# Patient Record
Sex: Male | Born: 1980 | Race: Black or African American | Hispanic: No | Marital: Married | Smoking: Never smoker
Health system: Southern US, Community
[De-identification: ages and names within clinical notes are randomized; demographics above are authoritative.]

## PROBLEM LIST (undated history)

## (undated) DIAGNOSIS — M3500 Sicca syndrome, unspecified: Secondary | ICD-10-CM

## (undated) DIAGNOSIS — J189 Pneumonia, unspecified organism: Secondary | ICD-10-CM

## (undated) DIAGNOSIS — N179 Acute kidney failure, unspecified: Secondary | ICD-10-CM

## (undated) DIAGNOSIS — I10 Essential (primary) hypertension: Secondary | ICD-10-CM

## (undated) DIAGNOSIS — L03211 Cellulitis of face: Secondary | ICD-10-CM

## (undated) DIAGNOSIS — M329 Systemic lupus erythematosus, unspecified: Secondary | ICD-10-CM

---

## 2015-06-17 HISTORY — PX: THORACENTESIS: SHX235

## 2015-06-28 DIAGNOSIS — N179 Acute kidney failure, unspecified: Secondary | ICD-10-CM | POA: Diagnosis present

## 2016-03-16 DIAGNOSIS — J189 Pneumonia, unspecified organism: Secondary | ICD-10-CM

## 2016-03-16 HISTORY — DX: Pneumonia, unspecified organism: J18.9

## 2016-04-22 ENCOUNTER — Observation Stay (HOSPITAL_COMMUNITY): Payer: BLUE CROSS/BLUE SHIELD

## 2016-04-22 ENCOUNTER — Inpatient Hospital Stay (HOSPITAL_COMMUNITY)
Admission: EM | Admit: 2016-04-22 | Discharge: 2016-04-28 | DRG: 683 | Disposition: A | Discharge status: Home / Self Care | Payer: BLUE CROSS/BLUE SHIELD | Attending: Internal Medicine | Admitting: Internal Medicine

## 2016-04-22 ENCOUNTER — Encounter (HOSPITAL_COMMUNITY): Payer: Self-pay | Admitting: Emergency Medicine

## 2016-04-22 DIAGNOSIS — R220 Localized swelling, mass and lump, head: Secondary | ICD-10-CM | POA: Diagnosis present

## 2016-04-22 DIAGNOSIS — J90 Pleural effusion, not elsewhere classified: Secondary | ICD-10-CM | POA: Diagnosis present

## 2016-04-22 DIAGNOSIS — I959 Hypotension, unspecified: Secondary | ICD-10-CM | POA: Diagnosis present

## 2016-04-22 DIAGNOSIS — D649 Anemia, unspecified: Secondary | ICD-10-CM | POA: Diagnosis present

## 2016-04-22 DIAGNOSIS — R509 Fever, unspecified: Secondary | ICD-10-CM

## 2016-04-22 DIAGNOSIS — M3500 Sicca syndrome, unspecified: Secondary | ICD-10-CM | POA: Diagnosis present

## 2016-04-22 DIAGNOSIS — Z79899 Other long term (current) drug therapy: Secondary | ICD-10-CM

## 2016-04-22 DIAGNOSIS — D638 Anemia in other chronic diseases classified elsewhere: Secondary | ICD-10-CM | POA: Diagnosis not present

## 2016-04-22 DIAGNOSIS — M329 Systemic lupus erythematosus, unspecified: Secondary | ICD-10-CM | POA: Diagnosis present

## 2016-04-22 DIAGNOSIS — T783XXA Angioneurotic edema, initial encounter: Secondary | ICD-10-CM | POA: Diagnosis present

## 2016-04-22 DIAGNOSIS — L03211 Cellulitis of face: Secondary | ICD-10-CM

## 2016-04-22 DIAGNOSIS — N179 Acute kidney failure, unspecified: Secondary | ICD-10-CM | POA: Diagnosis not present

## 2016-04-22 DIAGNOSIS — Z7952 Long term (current) use of systemic steroids: Secondary | ICD-10-CM

## 2016-04-22 DIAGNOSIS — Z880 Allergy status to penicillin: Secondary | ICD-10-CM

## 2016-04-22 DIAGNOSIS — Z8249 Family history of ischemic heart disease and other diseases of the circulatory system: Secondary | ICD-10-CM

## 2016-04-22 DIAGNOSIS — I10 Essential (primary) hypertension: Secondary | ICD-10-CM | POA: Diagnosis present

## 2016-04-22 DIAGNOSIS — R609 Edema, unspecified: Secondary | ICD-10-CM

## 2016-04-22 DIAGNOSIS — R700 Elevated erythrocyte sedimentation rate: Secondary | ICD-10-CM | POA: Diagnosis present

## 2016-04-22 DIAGNOSIS — Z833 Family history of diabetes mellitus: Secondary | ICD-10-CM

## 2016-04-22 DIAGNOSIS — Z888 Allergy status to other drugs, medicaments and biological substances status: Secondary | ICD-10-CM

## 2016-04-22 DIAGNOSIS — Z91041 Radiographic dye allergy status: Secondary | ICD-10-CM

## 2016-04-22 HISTORY — DX: Acute kidney failure, unspecified: N17.9

## 2016-04-22 HISTORY — DX: Essential (primary) hypertension: I10

## 2016-04-22 HISTORY — DX: Pneumonia, unspecified organism: J18.9

## 2016-04-22 HISTORY — DX: Sjogren syndrome, unspecified: M35.00

## 2016-04-22 HISTORY — DX: Systemic lupus erythematosus, unspecified: M32.9

## 2016-04-22 HISTORY — DX: Cellulitis of face: L03.211

## 2016-04-22 LAB — COMPREHENSIVE METABOLIC PANEL
ALK PHOS: 73 U/L (ref 38–126)
ALT: 31 U/L (ref 17–63)
AST: 47 U/L — ABNORMAL HIGH (ref 15–41)
Albumin: 2.8 g/dL — ABNORMAL LOW (ref 3.5–5.0)
Anion gap: 8 (ref 5–15)
BUN: 14 mg/dL (ref 6–20)
CALCIUM: 8.5 mg/dL — AB (ref 8.9–10.3)
CHLORIDE: 104 mmol/L (ref 101–111)
CO2: 27 mmol/L (ref 22–32)
CREATININE: 1.61 mg/dL — AB (ref 0.61–1.24)
GFR, EST NON AFRICAN AMERICAN: 53 mL/min — AB (ref >60)
Glucose, Bld: 141 mg/dL — ABNORMAL HIGH (ref 65–99)
Potassium: 3.7 mmol/L (ref 3.5–5.1)
Sodium: 139 mmol/L (ref 135–145)
Total Bilirubin: 0.3 mg/dL (ref 0.3–1.2)
Total Protein: 6.6 g/dL (ref 6.5–8.1)

## 2016-04-22 LAB — URINE MICROSCOPIC-ADD ON

## 2016-04-22 LAB — CBC
HCT: 32.7 % — ABNORMAL LOW (ref 39.0–52.0)
Hemoglobin: 9.9 g/dL — ABNORMAL LOW (ref 13.0–17.0)
MCH: 24.3 pg — ABNORMAL LOW (ref 26.0–34.0)
MCHC: 30.3 g/dL (ref 30.0–36.0)
MCV: 80.1 fL (ref 78.0–100.0)
PLATELETS: 210 10*3/uL (ref 150–400)
RBC: 4.08 MIL/uL — AB (ref 4.22–5.81)
RDW: 16 % — ABNORMAL HIGH (ref 11.5–15.5)
WBC: 4.9 10*3/uL (ref 4.0–10.5)

## 2016-04-22 LAB — PROTEIN / CREATININE RATIO, URINE
Creatinine, Urine: 278.04 mg/dL
PROTEIN CREATININE RATIO: 0.46 mg/mg{creat} — AB (ref 0.00–0.15)
TOTAL PROTEIN, URINE: 129 mg/dL

## 2016-04-22 LAB — URINALYSIS, ROUTINE W REFLEX MICROSCOPIC
Glucose, UA: NEGATIVE mg/dL
Hgb urine dipstick: NEGATIVE
Ketones, ur: 15 mg/dL — AB
NITRITE: NEGATIVE
PROTEIN: 100 mg/dL — AB
Specific Gravity, Urine: 1.021 (ref 1.005–1.030)
pH: 5.5 (ref 5.0–8.0)

## 2016-04-22 LAB — SODIUM, URINE, RANDOM: SODIUM UR: 47 mmol/L

## 2016-04-22 LAB — LACTIC ACID, PLASMA: Lactic Acid, Venous: 2.6 mmol/L (ref 0.5–1.9)

## 2016-04-22 LAB — C-REACTIVE PROTEIN: CRP: 3.7 mg/dL — AB (ref <1.0)

## 2016-04-22 LAB — SEDIMENTATION RATE: SED RATE: 49 mm/h — AB (ref 0–16)

## 2016-04-22 MED ORDER — SODIUM CHLORIDE 0.9 % IV BOLUS (SEPSIS)
1000.0000 mL | Freq: Once | INTRAVENOUS | Status: AC
  Administered 2016-04-22: 1000 mL via INTRAVENOUS

## 2016-04-22 MED ORDER — VANCOMYCIN HCL IN DEXTROSE 1-5 GM/200ML-% IV SOLN
1000.0000 mg | Freq: Once | INTRAVENOUS | Status: DC
  Filled 2016-04-22: qty 200

## 2016-04-22 MED ORDER — ACETAMINOPHEN 650 MG RE SUPP: 650.0000 mg | Freq: Four times a day (QID) | RECTAL | Status: DC | PRN

## 2016-04-22 MED ORDER — SODIUM CHLORIDE 0.9 % IV SOLN
INTRAVENOUS | Status: AC
  Administered 2016-04-22 – 2016-04-23 (×2): via INTRAVENOUS

## 2016-04-22 MED ORDER — ONDANSETRON HCL 4 MG/2ML IJ SOLN: 4.0000 mg | Freq: Four times a day (QID) | INTRAMUSCULAR | Status: DC | PRN

## 2016-04-22 MED ORDER — ENOXAPARIN SODIUM 40 MG/0.4ML ~~LOC~~ SOLN
40.0000 mg | SUBCUTANEOUS | Status: DC
  Administered 2016-04-23 – 2016-04-28 (×6): 40 mg via SUBCUTANEOUS
  Filled 2016-04-22 (×6): qty 0.4

## 2016-04-22 MED ORDER — ONDANSETRON HCL 4 MG PO TABS: 4.0000 mg | ORAL_TABLET | Freq: Four times a day (QID) | ORAL | Status: DC | PRN

## 2016-04-22 MED ORDER — PREDNISONE 10 MG PO TABS
10.0000 mg | ORAL_TABLET | Freq: Every day | ORAL | Status: DC
  Administered 2016-04-23: 10 mg via ORAL
  Filled 2016-04-22: qty 1

## 2016-04-22 MED ORDER — ACETAMINOPHEN 325 MG PO TABS
650.0000 mg | ORAL_TABLET | Freq: Four times a day (QID) | ORAL | Status: DC | PRN
  Administered 2016-04-23 – 2016-04-28 (×5): 650 mg via ORAL
  Filled 2016-04-22 (×5): qty 2

## 2016-04-22 MED ORDER — HYDROXYCHLOROQUINE SULFATE 200 MG PO TABS
400.0000 mg | ORAL_TABLET | Freq: Every day | ORAL | Status: DC
  Administered 2016-04-23: 200 mg via ORAL
  Administered 2016-04-24 – 2016-04-28 (×5): 400 mg via ORAL
  Filled 2016-04-22 (×7): qty 2

## 2016-04-22 NOTE — ED Triage Notes (Signed)
Pt here for swelling to face x 1 week now under neck; pt sts hx of cellulitis in past

## 2016-04-22 NOTE — ED Notes (Signed)
Admitting MD verbalized to not give pt vancomycin after hanging antibiotics and connecting it to the pt. Vancomycin not given.

## 2016-04-22 NOTE — ED Provider Notes (Signed)
[facility]-EMERGENCY DEPT Provider Note   CSN: [id number] Arrival date & time: 04/22/16  1552     History   Chief Complaint Chief Complaint  Patient presents with  . Facial Swelling    HPI Christell Constantony Thome is a 35 y.o. male.  HPI Pt with hx of SLE on plaquenil and prednisone comes in with cc of facial swelling. PT reports that his swelling started a week ago, and today the swelling spread to the neck, so he decided to come to the ER. Pt has hx of cellulitis with similar symptoms and needed admission. Pt has no n/v/f/c. No dental pain or drainage.  Past Medical History:  Diagnosis Date  . Hypertension   . Lupus     Patient Active Problem List   Diagnosis Date Noted  . Essential hypertension 04/22/2016  . Sjogren's syndrome (HCC) 04/22/2016  . SLE (systemic lupus erythematosus) (HCC) 04/22/2016  . Anemia in other chronic diseases classified elsewhere 04/22/2016  . Facial swelling 04/22/2016  . AKI (acute kidney injury) (HCC) 06/28/2015    Past Surgical History:  Procedure Laterality Date  . THORACENTESIS         Home Medications    Prior to Admission medications   Medication Sig Start Date End Date Taking? Authorizing Provider  hydroxychloroquine (PLAQUENIL) 200 MG tablet Take 400 mg by mouth daily.   Yes Historical Provider, MD  nebivolol (BYSTOLIC) 5 MG tablet Take 5 mg by mouth daily.   Yes Historical Provider, MD  predniSONE (DELTASONE) 10 MG tablet Take 10 mg by mouth daily with breakfast.   Yes Historical Provider, MD    Family History Family History  Problem Relation Age of Onset  . Hypertension Mother   . Diabetes Mother   . Diabetes Brother   . Hypertension Brother     Social History Social History  Substance Use Topics  . Smoking status: Never Smoker  . Smokeless tobacco: Never Used  . Alcohol use No     Allergies   Amoxicillin-pot clavulanate; Penicillins; Sulfamethoxazole-trimethoprim; Ivp dye [iodinated diagnostic agents]; Levaquin  [levofloxacin]; and Sulfa antibiotics   Review of Systems Review of Systems  ROS 10 Systems reviewed and are negative for acute change except as noted in the HPI.     Physical Exam Updated Vital Signs BP 114/67 (BP Location: Left Arm)   Pulse 89   Temp 98.4 F (36.9 C) (Oral)   Resp 18   Ht 5\' 9"  (1.753 m)   Wt 193 lb (87.5 kg)   SpO2 95%   BMI 28.50 kg/m   Physical Exam  Constitutional: He is oriented to person, place, and time. He appears well-developed.  HENT:  Head: Normocephalic and atraumatic.  No dental tenderness. gingva normal. No trismus.  + facial swelling  Eyes: Conjunctivae and EOM are normal. Pupils are equal, round, and reactive to light.  Neck: Normal range of motion. Neck supple.  Cardiovascular: Normal rate and regular rhythm.   Pulmonary/Chest: Effort normal and breath sounds normal.  Abdominal: Soft. Bowel sounds are normal. He exhibits no distension. There is no tenderness. There is no rebound and no guarding.  Neurological: He is alert and oriented to person, place, and time.  Skin: Skin is warm.  Nursing note and vitals reviewed.    ED Treatments / Results  Labs (all labs ordered are listed, but only abnormal results are displayed) Labs Reviewed  COMPREHENSIVE METABOLIC PANEL - Abnormal; Notable for the following:       Result Value   Glucose, Bld  141 (*)    Creatinine, Ser 1.61 (*)    Calcium 8.5 (*)    Albumin 2.8 (*)    AST 47 (*)    GFR calc non Af Amer 53 (*)    All other components within normal limits  CBC - Abnormal; Notable for the following:    RBC 4.08 (*)    Hemoglobin 9.9 (*)    HCT 32.7 (*)    MCH 24.3 (*)    RDW 16.0 (*)    All other components within normal limits  CULTURE, BLOOD (ROUTINE X 2)  CULTURE, BLOOD (ROUTINE X 2)  URINALYSIS, ROUTINE W REFLEX MICROSCOPIC (NOT AT [facility])  SODIUM, URINE, RANDOM  PROTEIN / CREATININE RATIO, URINE  C3 COMPLEMENT  C4 COMPLEMENT  ANTI-DNA ANTIBODY, DOUBLE-STRANDED    LACTIC ACID, PLASMA  SEDIMENTATION RATE  C-REACTIVE PROTEIN  BASIC METABOLIC PANEL  CBC    EKG  EKG Interpretation None       Radiology Dg Chest 2 View  Result Date: 04/22/2016 CLINICAL DATA:  Swelling to phase for 1 week cellulitis EXAM: CHEST  2 VIEW COMPARISON:  None. FINDINGS: There are low lung volumes. There are tiny bilateral pleural effusions. Mild streaky bibasilar atelectasis. Heart size upper normal. Pulmonary vascularity within normal limits. No pneumothorax. IMPRESSION: 1. Low lung volumes with small bilateral effusions 2. Streaky bibasilar atelectasis Electronically Signed   By: Jasmine PangKim  Fujinaga M.D.   On: 04/22/2016 21:06    Procedures Procedures (including critical care time)  Medications Ordered in ED Medications  hydroxychloroquine (PLAQUENIL) tablet 400 mg (not administered)  predniSONE (DELTASONE) tablet 10 mg (not administered)  enoxaparin (LOVENOX) injection 40 mg (not administered)  0.9 %  sodium chloride infusion (not administered)  acetaminophen (TYLENOL) tablet 650 mg (not administered)    Or  acetaminophen (TYLENOL) suppository 650 mg (not administered)  ondansetron (ZOFRAN) tablet 4 mg (not administered)    Or  ondansetron (ZOFRAN) injection 4 mg (not administered)     Initial Impression / Assessment and Plan / ED Course  I have reviewed the triage vital signs and the nursing notes.  Pertinent labs & imaging results that were available during my care of the patient were reviewed by me and considered in my medical decision making (see chart for details).  Clinical Course     Immunosuppressed male with facial swelling and fevers at home. Also reports neck swelling today. Airway is fine, and pt is controlling secretions fine. Pt has angioedema as the reaction to contrast - so we will no get CT. Admit with iv antibiotics - that Dr. Maryfrances Bunnellanford will decide upon after seeing the patient.  Final Clinical Impressions(s) / ED Diagnoses   Final  diagnoses:  Facial cellulitis  Fever    New Prescriptions Current Discharge Medication List       Derwood KaplanAnkit Clarke Peretz, MD 04/22/16 2250

## 2016-04-22 NOTE — ED Notes (Signed)
Admitting MD at bedside.

## 2016-04-22 NOTE — Progress Notes (Signed)
CRITICAL VALUE ALERT  Critical value received: Lactic acid  Date of notification:  04/22/16  Time of notification:  2219  Critical value read back:Yes.    Nurse who received alert:  Nelda MarseilleJenny Thacker, RN  MD notified (1st page):  Dr. Maryfrances Bunnellanford  Time of first page: 2224  MD notified (2nd page):  Time of second page:  Responding MD:  2225  Time MD responded: Dr. Maryfrances Bunnellanford, put in NS bolus 1 L

## 2016-04-22 NOTE — Progress Notes (Signed)
Received report from ED. Somara Frymire Thacker, RN 

## 2016-04-22 NOTE — Progress Notes (Addendum)
The patient is noted to have a lactate>2. With the current information available to me, I don't think the patient has infection or sepsis. The lactate>2, is related to dehydration. Will give fluids and repeat lactic acid and continue to monitor clinically.

## 2016-04-22 NOTE — Progress Notes (Signed)
Pt admitted to 5w19 from ED. Pt is A&Ox4. Pt lives at home. Pt's neck is slightly swollen. Pt's skin is warm, dry and intact. Pt oriented to room. Told to call for assistance. Will continue to monitor pt. Jerrye BushyJenny Thacker,RN

## 2016-04-22 NOTE — H&P (Signed)
History and Physical  Patient Name: Marvin Wong     TZG:[id number]    DOB: 06/08/1981    DOA: 04/22/2016 PCP: No PCP Per Patient  Rheumatology: Dr. Rosette Reveal, [place]  [phone] Patient coming from: Home  Chief Complaint: Facial swelling, fever  HPI: Marvin Wong is a 35 y.o. male with a past medical history significant for SLE and Sjogren's and HTN who presents with facial swelling for 1 week.  The patient was in his usual state of health until about 10 days ago when he had onset of swelling in his cheeks on both sides without rash.  This waxed and waned, with mild discomfort for about a week, and then in the last few days, the cheeks resolved, but he developed a fullness appearance under his neck.  This was without stridor, tongue or lip swelling, dyspnea, redness or pain.  He has had a low grade fever on and off this week as well as vague malaise.  No new joint pain, other pain, rashes, oral lesions.  He was admitted to [facility] again 1 month ago for pneumonia, treated with ceftriaxone and azithromycin for a week and discharged in stable condition.    ED course: -Afebrile, heart rate 100s, respirations and pulse ox normal, BP 97/62 -Na 139, K 3.7, Cr 1.67 (baseline 0.8 in Feb), WBC 4.9K, Hgb 9.9 and normocytic, stable -Albumin 2.9, AST minimal elevation -Vancomycin was ordered for facial cellulitis and TRH were asked to evaluate   The patient has a history of facial swelling in the past.  18 months ago he was admitted to [facility] for facial cellulitis, treated with IV antibiotics to resolution he thinks.  He thinks he had another episode in January of this year at [facility], although these records were obtained via [facility] and show that he was transferred to [facility] from [place] for pneumonia and facial swelling, and his facial swelling was judged to be "angioedema" from a drug reaction to Augmentin and Zosyn rather than from infection.  He was also having a  lupus flare at that time as well as AKI with significant proteinuria and was treated with solumedrol and his facial swelling resolved.  He was back at [facility] in Feb 2017 for thoracentesis (WBC 4224/mm^3, ph 7.7, LDH 276, protein 4.9, and negative culture).           ROS: Review of Systems  Constitutional: Positive for fever and malaise/fatigue.  HENT: Negative for congestion and sore throat.   Respiratory: Positive for sputum production (persistent from pneumonia, not new or worsening).   Musculoskeletal: Positive for joint pain (chronic, none new).  Skin: Negative for rash.  All other systems reviewed and are negative.         Past Medical History:  Diagnosis Date  . Hypertension   . Lupus     Past Surgical History:  Procedure Laterality Date  . THORACENTESIS      Social History: Patient lives with his wife.  The patient walks unassisted.  He works at [employer].  He lives in [place].  He does not smoke.    Allergies  Allergen Reactions  . Amoxicillin-Pot Clavulanate Swelling  . Penicillins Swelling  . Sulfamethoxazole-Trimethoprim Nausea And Vomiting  . Ivp Dye [Iodinated Diagnostic Agents]   . Levaquin [Levofloxacin]   . Sulfa Antibiotics     Family history: family history includes Diabetes in his brother and mother; Hypertension in his brother and mother.  Prior to Admission medications   Medication Sig Start Date  End Date Taking? Authorizing Provider  hydroxychloroquine (PLAQUENIL) 200 MG tablet Take 400 mg by mouth daily.   Yes Historical Provider, MD  nebivolol (BYSTOLIC) 5 MG tablet Take 5 mg by mouth daily.   Yes Historical Provider, MD  predniSONE (DELTASONE) 10 MG tablet Take 10 mg by mouth daily with breakfast.   Yes Historical Provider, MD       Physical Exam: BP 105/74 (BP Location: Right Arm)   Pulse 90   Temp 98.2 F (36.8 C) (Oral)   Resp 18   Ht _0  (1.753 m)   Wt 87.5 kg (193 lb)   SpO2 97%   BMI 28.50 kg/m  General appearance:  Well-developed, adult male, alert and in no acute distress, appear tired.   Eyes: Anicteric, conjunctiva pink, lids and lashes normal. PERRL.    ENT: No nasal deformity, discharge, epistaxis.  Hearing normal. OP moist without lesions.  No lip swelling, tongue swelling, posterior pharynx lesions. Neck: No neck masses.  Trachea midline.  No thyromegaly/tenderness.  No neck tenderness. Lymph: No cervical or supraclavicular lymphadenopathy. Skin: Warm and dry.  No jaundice.  No suspicious rashes or lesions.  ?Mild fullness below neck per sister at bedside, otherwise no tissue edema, swelling, tenderness, fluctuance, mass in neck.     Cardiac: RRR, nl S1-S2, no murmurs appreciated.  Capillary refill is brisk.  JVP not visible.  No LE edema.  Radial and DP pulses 2+ and symmetric. Respiratory: Normal respiratory rate and rhythm.  CTAB without rales or wheezes. Abdomen: Abdomen soft.  No TTP. No ascites, distension, hepatosplenomegaly.   MSK: No deformities or effusions.  No cyanosis or clubbing. Neuro: Cranial nerves normal.  Sensation intact to light touch. Speech is fluent.  Muscle strength normal.    Psych: Sensorium intact and responding to questions, attention normal.  Behavior appropriate.  Affectblunted.  Judgment and insight appear normal.     Labs on Admission:  I have personally reviewed following labs and imaging studies: CBC:  Recent Labs Lab 04/22/16 1610  WBC 4.9  HGB 9.9*  HCT 32.7*  MCV 80.1  PLT 622   Basic Metabolic Panel:  Recent Labs Lab 04/22/16 1610  NA 139  K 3.7  CL 104  CO2 27  GLUCOSE 141*  BUN 14  CREATININE 1.61*  CALCIUM 8.5*   GFR: Estimated Creatinine Clearance: 68.8 mL/min (by C-G formula based on SCr of 1.61 mg/dL (H)).  Liver Function Tests:  Recent Labs Lab 04/22/16 1610  AST 47*  ALT 31  ALKPHOS 73  BILITOT 0.3  PROT 6.6  ALBUMIN 2.8*   No results for input(s): LIPASE, AMYLASE in the last 168 hours. No results for input(s):  AMMONIA in the last 168 hours. Coagulation Profile: No results for input(s): INR, PROTIME in the last 168 hours. Cardiac Enzymes: No results for input(s): CKTOTAL, CKMB, CKMBINDEX, TROPONINI in the last 168 hours. BNP (last 3 results) No results for input(s): PROBNP in the last 8760 hours. HbA1C: No results for input(s): HGBA1C in the last 72 hours. CBG: No results for input(s): GLUCAP in the last 168 hours. Lipid Profile: No results for input(s): CHOL, HDL, LDLCALC, TRIG, CHOLHDL, LDLDIRECT in the last 72 hours. Thyroid Function Tests: No results for input(s): TSH, T4TOTAL, FREET4, T3FREE, THYROIDAB in the last 72 hours. Anemia Panel: No results for input(s): VITAMINB12, FOLATE, FERRITIN, TIBC, IRON, RETICCTPCT in the last 72 hours. Sepsis Labs: Lactate pending Invalid input(s): PROCALCITONIN, LACTICIDVEN No results found for this or any previous visit (from  the past 240 hour(s)).       Radiological Exams on Admission: Personally reviewed CXR shows no airspace disease or significant effusions: Dg Chest 2 View  Result Date: 04/22/2016 CLINICAL DATA:  Swelling to phase for 1 week cellulitis EXAM: CHEST  2 VIEW COMPARISON:  None. FINDINGS: There are low lung volumes. There are tiny bilateral pleural effusions. Mild streaky bibasilar atelectasis. Heart size upper normal. Pulmonary vascularity within normal limits. No pneumothorax. IMPRESSION: 1. Low lung volumes with small bilateral effusions 2. Streaky bibasilar atelectasis Electronically Signed   By: Donavan Foil M.D.   On: 04/22/2016 21:06        Assessment/Plan  1. AKI:  Baseline creatinine 0.8, creatinine currently doubled.  No recent NSAIDs, hypotension known (although he is relatively hypotensive at present).  No significant vomiting, diarrhea.       -Check UA -Check urine protein -Check FeNA -Administer fluid challenge -Repeat BMP tomorrow -Avoid nephrotoxins  -Check dsDNA titers, complements, ESR/CRP   2.  Hypertension:  -Hold Bystolic given relative hypotension at present  3. Facial swelling:  No fever, leukocytosis.  No tissue changes on my exam to suggest cellulitis (skin is soft, nontender).  Time course and migratory location of swelling also militate against diagnosis of cellulitis.    4. SLE and Sjogrens:  -Continue prednisone 10 mg for now -Continue plaquenil  5. Fever:  -Check CXR -Check blood cultures             DVT prophylaxis: Lovenox  Code Status: FULL  Family Communication: Sister at bedsdie  Disposition Plan: Anticipate further studies, observe facial swelling for recurrence, monitor renal function and follow up lab studies from tonight.  If lupus flare contributing, will discuss with Rheum Dr. Ena Dawley (phone # listed above) and start steroids, possibly renal as well. Consults called: None overnight Admission status: OBS, med surg At the point of initial evaluation, it is my clinical opinion that admission for OBSERVATION is reasonable and necessary because the patient's presenting complaints in the context of their chronic conditions represent sufficient risk of deterioration or significant morbidity to constitute reasonable grounds for close observation in the hospital setting, but that the patient may be medically stable for discharge from the hospital within 24 to 48 hours.    Medical decision making: Patient seen at 8:05 PM on 04/22/2016.  The patient was discussed with Dr. Kathrynn Humble.  What exists of the patient's chart was reviewed in depth and outside records were obtained and summarized above.  Clinical condition: stable.        Edwin Dada [facility] Pager [phone]

## 2016-04-22 NOTE — ED Notes (Signed)
Phlebotomy at bedside.

## 2016-04-22 NOTE — ED Notes (Signed)
Patient transported to X-ray 

## 2016-04-23 DIAGNOSIS — M329 Systemic lupus erythematosus, unspecified: Secondary | ICD-10-CM | POA: Diagnosis present

## 2016-04-23 DIAGNOSIS — Z7952 Long term (current) use of systemic steroids: Secondary | ICD-10-CM | POA: Diagnosis not present

## 2016-04-23 DIAGNOSIS — R220 Localized swelling, mass and lump, head: Secondary | ICD-10-CM | POA: Diagnosis not present

## 2016-04-23 DIAGNOSIS — Z8249 Family history of ischemic heart disease and other diseases of the circulatory system: Secondary | ICD-10-CM | POA: Diagnosis not present

## 2016-04-23 DIAGNOSIS — Z79899 Other long term (current) drug therapy: Secondary | ICD-10-CM | POA: Diagnosis not present

## 2016-04-23 DIAGNOSIS — M3500 Sicca syndrome, unspecified: Secondary | ICD-10-CM | POA: Diagnosis present

## 2016-04-23 DIAGNOSIS — D649 Anemia, unspecified: Secondary | ICD-10-CM | POA: Diagnosis present

## 2016-04-23 DIAGNOSIS — I10 Essential (primary) hypertension: Secondary | ICD-10-CM | POA: Diagnosis present

## 2016-04-23 DIAGNOSIS — M3219 Other organ or system involvement in systemic lupus erythematosus: Secondary | ICD-10-CM | POA: Diagnosis not present

## 2016-04-23 DIAGNOSIS — J90 Pleural effusion, not elsewhere classified: Secondary | ICD-10-CM | POA: Diagnosis present

## 2016-04-23 DIAGNOSIS — R509 Fever, unspecified: Secondary | ICD-10-CM | POA: Diagnosis present

## 2016-04-23 DIAGNOSIS — Z888 Allergy status to other drugs, medicaments and biological substances status: Secondary | ICD-10-CM | POA: Diagnosis not present

## 2016-04-23 DIAGNOSIS — N179 Acute kidney failure, unspecified: Secondary | ICD-10-CM | POA: Diagnosis present

## 2016-04-23 DIAGNOSIS — I959 Hypotension, unspecified: Secondary | ICD-10-CM | POA: Diagnosis present

## 2016-04-23 DIAGNOSIS — Z880 Allergy status to penicillin: Secondary | ICD-10-CM | POA: Diagnosis not present

## 2016-04-23 DIAGNOSIS — R700 Elevated erythrocyte sedimentation rate: Secondary | ICD-10-CM | POA: Diagnosis present

## 2016-04-23 DIAGNOSIS — T783XXA Angioneurotic edema, initial encounter: Secondary | ICD-10-CM | POA: Diagnosis present

## 2016-04-23 DIAGNOSIS — D638 Anemia in other chronic diseases classified elsewhere: Secondary | ICD-10-CM | POA: Diagnosis not present

## 2016-04-23 DIAGNOSIS — Z91041 Radiographic dye allergy status: Secondary | ICD-10-CM | POA: Diagnosis not present

## 2016-04-23 DIAGNOSIS — Z833 Family history of diabetes mellitus: Secondary | ICD-10-CM | POA: Diagnosis not present

## 2016-04-23 LAB — CBC
HEMATOCRIT: 34.7 % — AB (ref 39.0–52.0)
HEMOGLOBIN: 10.4 g/dL — AB (ref 13.0–17.0)
MCH: 23.9 pg — AB (ref 26.0–34.0)
MCHC: 30 g/dL (ref 30.0–36.0)
MCV: 79.8 fL (ref 78.0–100.0)
Platelets: 192 10*3/uL (ref 150–400)
RBC: 4.35 MIL/uL (ref 4.22–5.81)
RDW: 16.1 % — ABNORMAL HIGH (ref 11.5–15.5)
WBC: 2.7 10*3/uL — ABNORMAL LOW (ref 4.0–10.5)

## 2016-04-23 LAB — INFLUENZA PANEL BY PCR (TYPE A & B)
Influenza A By PCR: NEGATIVE
Influenza B By PCR: NEGATIVE

## 2016-04-23 LAB — BASIC METABOLIC PANEL
ANION GAP: 10 (ref 5–15)
BUN: 17 mg/dL (ref 6–20)
CALCIUM: 8.2 mg/dL — AB (ref 8.9–10.3)
CO2: 23 mmol/L (ref 22–32)
Chloride: 106 mmol/L (ref 101–111)
Creatinine, Ser: 1.23 mg/dL (ref 0.61–1.24)
GFR calc Af Amer: >60 mL/min (ref >60)
GFR calc non Af Amer: >60 mL/min (ref >60)
GLUCOSE: 85 mg/dL (ref 65–99)
Potassium: 4.1 mmol/L (ref 3.5–5.1)
Sodium: 139 mmol/L (ref 135–145)

## 2016-04-23 LAB — RESPIRATORY PANEL BY PCR
Adenovirus: NOT DETECTED
Bordetella pertussis: NOT DETECTED
CORONAVIRUS OC43-RVPPCR: NOT DETECTED
Chlamydophila pneumoniae: NOT DETECTED
Coronavirus 229E: NOT DETECTED
Coronavirus HKU1: NOT DETECTED
Coronavirus NL63: NOT DETECTED
INFLUENZA A-RVPPCR: NOT DETECTED
INFLUENZA B-RVPPCR: NOT DETECTED
METAPNEUMOVIRUS-RVPPCR: NOT DETECTED
Mycoplasma pneumoniae: NOT DETECTED
PARAINFLUENZA VIRUS 1-RVPPCR: NOT DETECTED
PARAINFLUENZA VIRUS 2-RVPPCR: NOT DETECTED
PARAINFLUENZA VIRUS 4-RVPPCR: NOT DETECTED
Parainfluenza Virus 3: NOT DETECTED
RESPIRATORY SYNCYTIAL VIRUS-RVPPCR: NOT DETECTED
RHINOVIRUS / ENTEROVIRUS - RVPPCR: NOT DETECTED

## 2016-04-23 LAB — LACTIC ACID, PLASMA: LACTIC ACID, VENOUS: 1.9 mmol/L (ref 0.5–1.9)

## 2016-04-23 MED ORDER — PREDNISONE 20 MG PO TABS
20.0000 mg | ORAL_TABLET | Freq: Two times a day (BID) | ORAL | Status: DC
Start: 2016-04-23 — End: 2016-04-28
  Administered 2016-04-23 – 2016-04-28 (×9): 20 mg via ORAL
  Filled 2016-04-23 (×9): qty 1

## 2016-04-23 MED ORDER — BENZONATATE 100 MG PO CAPS: 200.0000 mg | ORAL_CAPSULE | Freq: Three times a day (TID) | ORAL | Status: DC | PRN

## 2016-04-23 MED ORDER — PREDNISONE 20 MG PO TABS: 40.0000 mg | ORAL_TABLET | Freq: Every day | ORAL | Status: DC

## 2016-04-23 MED ORDER — DEXTROSE 5 % IV SOLN
1.0000 g | INTRAVENOUS | Status: DC
  Administered 2016-04-23 – 2016-04-24 (×2): 1 g via INTRAVENOUS
  Filled 2016-04-23 (×2): qty 10

## 2016-04-23 MED ORDER — SODIUM CHLORIDE 0.9 % IV SOLN
INTRAVENOUS | Status: DC
  Administered 2016-04-24 (×2): via INTRAVENOUS

## 2016-04-23 MED ORDER — PREDNISONE 10 MG PO TABS
10.0000 mg | ORAL_TABLET | Freq: Once | ORAL | Status: AC
  Administered 2016-04-23: 10 mg via ORAL
  Filled 2016-04-23: qty 1

## 2016-04-23 NOTE — Progress Notes (Signed)
Notified Dr. Maryfrances Bunnellanford that pt's temp is 102.1 and pt having chills. MD put in order for Rocephin and stated to give tylenol as ordered for temp. Will continue to monitor pt. Nelda MarseilleJenny Thacker, RN

## 2016-04-23 NOTE — Progress Notes (Signed)
Temp up to 103.5 this am, pt given tylenol per prn order. Pt declined to take am plaquenil--states after he took dose yesterday it made his throat feel funny.  Pt's family at bedside inquiring about antibiotics and asking if they will put him on more than one, states pt had been on vancomycin at another hospital when he had something like this before   Message sent to Dr Catha GosselinMikhail.

## 2016-04-23 NOTE — Progress Notes (Signed)
PROGRESS NOTE    Marvin Wong  ZOX:[id number] DOB: 10/25/1980 DOA: 04/22/2016 PCP: No PCP Per Patient   Chief Complaint  Patient presents with  . Facial Swelling    Brief Narrative:  Marvin Wong is a 35 y.o. male with a past medical history significant for SLE and Sjogren's and HTN who presents with facial swelling for 1 week.   The patient was in his usual state of health until about 10 days ago when he had onset of swelling in his cheeks on both sides without rash.  This waxed and waned, with mild discomfort for about a week, and then in the last few days, the cheeks resolved, but he developed a fullness appearance under his neck.  This was without stridor, tongue or lip swelling, dyspnea, redness or pain.  He has had a low grade fever on and off this week as well as vague malaise.  No new joint pain, other pain, rashes, oral lesions. He was admitted to [facility] 1 month ago for pneumonia, treated with ceftriaxone and azithromycin for a week and discharged in stable condition.    Admitted for AKI and facial swelling.  Found to have SIRS. Blood/urine cultures pending. Also ordered flu/viral panel.   Assessment & Plan   Acute kidney injury -Baseline creatinine approximately 0.8, upon admission 1.61 -Currently creatinine 1.23 -Suspect due to dehydration -Continue IV fluids -Continue to monitor BMP  SIRS -Unknown infectious etiology at this time. -UA and chest x-ray unremarkable for infection -Patient started empirically on ceftriaxone -Given fever as well as general malaise, will workup for influenza or viral cause -Family continues to ask about starting more antibiotics as patient was recently [facility] -Spoke with infectious disease, agree with current plan of working up for viral illness. Facial swelling -Cannot appreciate cellulitis or any tissue changes on exam -Supposedly has been ongoing for over a week -Question whether  this is due to lupus flare versus infection -Continue prednisone -Will order antitussive  Lupus and Sjogren's -Continue prednisone and Plaquenil  Essential hypertension -Bystolic held due to relative hypotension  DVT Prophylaxis  lovenox  Code Status: Full  Family Communication: None at bedside. Spoke with patient's PCP, Dr. Floydene FlockViola Jones, via phone.  Disposition Plan: Observation. Discharge to home when stable.   Consultants Infectious disease, via phone  Procedures  None  Antibiotics   Anti-infectives    Start     Dose/Rate Route Frequency Ordered Stop   04/23/16 1000  hydroxychloroquine (PLAQUENIL) tablet 400 mg     400 mg Oral Daily 04/22/16 2203     04/23/16 0300  cefTRIAXone (ROCEPHIN) 1 g in dextrose 5 % 50 mL IVPB     1 g 100 mL/hr over 30 Minutes Intravenous Every 24 hours 04/23/16 0229     04/22/16 1945  vancomycin (VANCOCIN) IVPB 1000 mg/200 mL premix  Status:  Discontinued     1,000 mg 200 mL/hr over 60 Minutes Intravenous  Once 04/22/16 1934 04/22/16 2035      Subjective:   Marvin Wong seen and examined today.  Patient continues to complain of facial and neck swelling.  Complains of sore throat and feeling tired. Complains of dry cough.  Denies chest pain, shortness of breath, abdominal pain, N/V/C/D.     Objective:   Vitals:   04/23/16 0220 04/23/16 0402 04/23/16 0500 04/23/16 0949  BP:   (!) 93/51   Pulse:   99   Resp:   18   Temp: (!) 102.1  F (38.9 C) (!) 102.5 F (39.2 C) 100.3 F (37.9 C) (!) 103.5 F (39.7 C)  TempSrc: Oral Oral Oral Oral  SpO2:   96%   Weight:      Height:        Intake/Output Summary (Last 24 hours) at 04/23/16 1309 Last data filed at 04/23/16 1120  Gross per 24 hour  Intake             2265 ml  Output             1325 ml  Net              940 ml   Filed Weights   04/22/16 1601  Weight: 87.5 kg (193 lb)    Exam  General: Well developed, well nourished, NAD, appears stated age  HEENT: NCAT, PERRLA, EOMI,  Anicteic Sclera, mucous membranes moist. No areas of fluctuance, no lip/tongue swelling  Neck: Supple, no JVD, no masses  Cardiovascular: S1 S2 auscultated, no rubs, murmurs or gallops. Regular rate and rhythm.  Respiratory: Clear to auscultation bilaterally with equal chest rise  Abdomen: Soft, nontender, nondistended, + bowel sounds  Extremities: warm dry without cyanosis clubbing or edema  Neuro: AAOx3, cranial nerves grossly intact. Strength 5/5 in patient's upper and lower extremities bilaterally  Skin: Without rashes exudates or nodules  Psych: Normal affect and demeanor with intact judgement and insight  Data Reviewed: I have personally reviewed following labs and imaging studies  CBC:  Recent Labs Lab 04/22/16 1610 04/23/16 0213  WBC 4.9 2.7*  HGB 9.9* 10.4*  HCT 32.7* 34.7*  MCV 80.1 79.8  PLT 210 192   Basic Metabolic Panel:  Recent Labs Lab 04/22/16 1610 04/23/16 0213  NA 139 139  K 3.7 4.1  CL 104 106  CO2 27 23  GLUCOSE 141* 85  BUN 14 17  CREATININE 1.61* 1.23  CALCIUM 8.5* 8.2*   GFR: Estimated Creatinine Clearance: 90 mL/min (by C-G formula based on SCr of 1.23 mg/dL). Liver Function Tests:  Recent Labs Lab 04/22/16 1610  AST 47*  ALT 31  ALKPHOS 73  BILITOT 0.3  PROT 6.6  ALBUMIN 2.8*   No results for input(s): LIPASE, AMYLASE in the last 168 hours. No results for input(s): AMMONIA in the last 168 hours. Coagulation Profile: No results for input(s): INR, PROTIME in the last 168 hours. Cardiac Enzymes: No results for input(s): CKTOTAL, CKMB, CKMBINDEX, TROPONINI in the last 168 hours. BNP (last 3 results) No results for input(s): PROBNP in the last 8760 hours. HbA1C: No results for input(s): HGBA1C in the last 72 hours. CBG: No results for input(s): GLUCAP in the last 168 hours. Lipid Profile: No results for input(s): CHOL, HDL, LDLCALC, TRIG, CHOLHDL, LDLDIRECT in the last 72 hours. Thyroid Function Tests: No results for  input(s): TSH, T4TOTAL, FREET4, T3FREE, THYROIDAB in the last 72 hours. Anemia Panel: No results for input(s): VITAMINB12, FOLATE, FERRITIN, TIBC, IRON, RETICCTPCT in the last 72 hours. Urine analysis:    Component Value Date/Time   COLORURINE YELLOW 04/22/2016 2230   APPEARANCEUR CLOUDY (A) 04/22/2016 2230   LABSPEC 1.021 04/22/2016 2230   PHURINE 5.5 04/22/2016 2230   GLUCOSEU NEGATIVE 04/22/2016 2230   HGBUR NEGATIVE 04/22/2016 2230   BILIRUBINUR SMALL (A) 04/22/2016 2230   KETONESUR 15 (A) 04/22/2016 2230   PROTEINUR 100 (A) 04/22/2016 2230   NITRITE NEGATIVE 04/22/2016 2230   LEUKOCYTESUR TRACE (A) 04/22/2016 2230   Sepsis Labs: @LABRCNTIP (procalcitonin:4,lacticidven:4)  ) Recent Results (from the  past 240 hour(s))  Culture, blood (routine x 2)     Status: None (Preliminary result)   Collection Time: 04/22/16  9:07 PM  Result Value Ref Range Status   Specimen Description BLOOD LEFT ARM  Final   Special Requests BOTTLES DRAWN AEROBIC AND ANAEROBIC 5CC  Final   Culture NO GROWTH < 24 HOURS  Final   Report Status PENDING  Incomplete  Culture, blood (routine x 2)     Status: None (Preliminary result)   Collection Time: 04/22/16  9:15 PM  Result Value Ref Range Status   Specimen Description BLOOD LEFT ANTECUBITAL  Final   Special Requests BOTTLES DRAWN AEROBIC AND ANAEROBIC 5CC  Final   Culture NO GROWTH < 24 HOURS  Final   Report Status PENDING  Incomplete      Radiology Studies: Dg Chest 2 View  Result Date: 04/22/2016 CLINICAL DATA:  Swelling to phase for 1 week cellulitis EXAM: CHEST  2 VIEW COMPARISON:  None. FINDINGS: There are low lung volumes. There are tiny bilateral pleural effusions. Mild streaky bibasilar atelectasis. Heart size upper normal. Pulmonary vascularity within normal limits. No pneumothorax. IMPRESSION: 1. Low lung volumes with small bilateral effusions 2. Streaky bibasilar atelectasis Electronically Signed   By: Jasmine Pang M.D.   On: 04/22/2016  21:06     Scheduled Meds: . cefTRIAXone (ROCEPHIN)  IV  1 g Intravenous Q24H  . enoxaparin (LOVENOX) injection  40 mg Subcutaneous Q24H  . hydroxychloroquine  400 mg Oral Daily  . predniSONE  10 mg Oral Once  . predniSONE  20 mg Oral BID WC   Continuous Infusions: . sodium chloride       LOS: 0 days   Time Spent in minutes   45 minutes  Devlin Mcveigh D.O. on 04/23/2016 at 1:09 PM  Between 7am to 7pm - Pager - [phone]  After 7pm go to [email or web address] - password [email or web address]  And look for the night coverage person covering for me after hours  [facility] Office  [phone]

## 2016-04-23 NOTE — Progress Notes (Signed)
Notified Dr. Maryfrances Bunnellanford that tylenol is ineffective. Temp is now 102.5. Will continue to monitor pt. Nelda MarseilleJenny Thacker, RN

## 2016-04-24 DIAGNOSIS — R220 Localized swelling, mass and lump, head: Secondary | ICD-10-CM

## 2016-04-24 DIAGNOSIS — N179 Acute kidney failure, unspecified: Principal | ICD-10-CM

## 2016-04-24 DIAGNOSIS — M3219 Other organ or system involvement in systemic lupus erythematosus: Secondary | ICD-10-CM

## 2016-04-24 LAB — CBC WITH DIFFERENTIAL/PLATELET
Basophils Absolute: 0.1 10*3/uL (ref 0.0–0.1)
Basophils Relative: 2 %
EOS PCT: 0 %
Eosinophils Absolute: 0 10*3/uL (ref 0.0–0.7)
HEMATOCRIT: 31.4 % — AB (ref 39.0–52.0)
Hemoglobin: 9.9 g/dL — ABNORMAL LOW (ref 13.0–17.0)
LYMPHS ABS: 0.2 10*3/uL — AB (ref 0.7–4.0)
Lymphocytes Relative: 5 %
MCH: 24.7 pg — ABNORMAL LOW (ref 26.0–34.0)
MCHC: 31.5 g/dL (ref 30.0–36.0)
MCV: 78.3 fL (ref 78.0–100.0)
MONOS PCT: 6 %
Monocytes Absolute: 0.2 10*3/uL (ref 0.1–1.0)
NEUTROS PCT: 87 %
Neutro Abs: 3.1 10*3/uL (ref 1.7–7.7)
Platelets: 188 10*3/uL (ref 150–400)
RBC: 4.01 MIL/uL — AB (ref 4.22–5.81)
RDW: 16.3 % — ABNORMAL HIGH (ref 11.5–15.5)
WBC: 3.6 10*3/uL — AB (ref 4.0–10.5)

## 2016-04-24 LAB — BASIC METABOLIC PANEL
Anion gap: 6 (ref 5–15)
BUN: 12 mg/dL (ref 6–20)
CHLORIDE: 107 mmol/L (ref 101–111)
CO2: 24 mmol/L (ref 22–32)
Calcium: 8.2 mg/dL — ABNORMAL LOW (ref 8.9–10.3)
Creatinine, Ser: 0.71 mg/dL (ref 0.61–1.24)
GFR calc Af Amer: >60 mL/min (ref >60)
GFR calc non Af Amer: >60 mL/min (ref >60)
Glucose, Bld: 130 mg/dL — ABNORMAL HIGH (ref 65–99)
POTASSIUM: 4.4 mmol/L (ref 3.5–5.1)
SODIUM: 137 mmol/L (ref 135–145)

## 2016-04-24 LAB — URINE CULTURE: Culture: NO GROWTH

## 2016-04-24 LAB — C4 COMPLEMENT: Complement C4, Body Fluid: 6 mg/dL — ABNORMAL LOW (ref 14–44)

## 2016-04-24 LAB — ANTI-DNA ANTIBODY, DOUBLE-STRANDED

## 2016-04-24 LAB — C3 COMPLEMENT: C3 COMPLEMENT: 48 mg/dL — AB (ref 82–167)

## 2016-04-24 NOTE — Progress Notes (Signed)
PROGRESS NOTE    Marvin Wong  [id number] DOB: 06/17/1980 DOA: 04/22/2016 PCP: No PCP Per Patient    Brief Narrative:  35 yo male with lupus and sjogren's presents with facial and neck edema, high ds dna and low complement, admitted to rule out infection.   Assessment & Plan:   Principal Problem:   AKI (acute kidney injury) (HCC) Active Problems:   Essential hypertension   Sjogren's syndrome (HCC)   SLE (systemic lupus erythematosus) (HCC)   Anemia in other chronic diseases classified elsewhere   Facial swelling   1. Facial swelling. Suspected steroid induced, patient had steroid dose recently increased, doubt secondary to lupus flare. No signs of upper airway compromise, no stridor or dyspnea. Will stop IV fluids and antibiotic for now.   2. SLE. On admission ds dna elevated and low complement, will recheck parameters, if not elevated may consider decrease steroid dose. Noted no elevated blood pressure or electrolyte disturbances. Continue plaquenil for now.   3. Sjogren's Clinically stable.   4. AKI. Renal function with improved cr down to 0.71 with K at 4,4 and Na at 137. Will recheck serum complement.      DVT prophylaxis: enoxaparin  Code Status: full  Family Communication: I spoke with patient's family at the bedside and all questions were addressed. Disposition Plan: home    Consultants:     Procedures:    Antimicrobials:       Subjective: Patient with facial and neck fullness, no dyspnea or chest pain, no odynophagia. Patient's home dose of prednisone 20 mg, increased to 60 mg and the down to 40 mg. No joint pain. At home had elevated temperature.    Objective: Vitals:   04/23/16 2040 04/23/16 2152 04/24/16 0545 04/24/16 0800  BP:  (!) 108/58 123/73 118/77  Pulse:  100 94 93  Resp:  18 18   Temp: 99.8 F (37.7 C) 99.1 F (37.3 C) 98.5 F (36.9 C) 98.4 F (36.9 C)  TempSrc:  Oral Oral Oral  SpO2:  98% 100% 100%  Weight:      Height:         Intake/Output Summary (Last 24 hours) at 04/24/16 1221 Last data filed at 04/24/16 1158  Gross per 24 hour  Intake          2035.83 ml  Output             3630 ml  Net         -1594.17 ml   Filed Weights   04/22/16 1601  Weight: 87.5 kg (193 lb)    Examination:  General exam: not in pain or dyspnea E ENT. No pallor or icterus, oral mucosa moist. Noted facial and neck fullness, no stridor.  Respiratory system: Clear to auscultation. Respiratory effort normal. No wheezing, rales or rhonchi.  Cardiovascular system: S1 & S2 heard, RRR. No JVD, murmurs, rubs, gallops or clicks. No pedal edema. Gastrointestinal system: Abdomen is nondistended, soft and nontender. No organomegaly or masses felt. Normal bowel sounds heard. Central nervous system: Alert and oriented. No focal neurological deficits. Extremities: Symmetric 5 x 5 power. Skin: No rashes, lesions or ulcers     Data Reviewed: I have personally reviewed following labs and imaging studies  CBC:  Recent Labs Lab 04/22/16 1610 04/23/16 0213 04/24/16 0508  WBC 4.9 2.7* 3.6*  NEUTROABS  --   --  3.1  HGB 9.9* 10.4* 9.9*  HCT 32.7* 34.7* 31.4*  MCV 80.1 79.8 78.3  PLT 210 192 188  Basic Metabolic Panel:  Recent Labs Lab 04/22/16 1610 04/23/16 0213 04/24/16 0508  NA 139 139 137  K 3.7 4.1 4.4  CL 104 106 107  CO2 27 23 24   GLUCOSE 141* 85 130*  BUN 14 17 12   CREATININE 1.61* 1.23 0.71  CALCIUM 8.5* 8.2* 8.2*   GFR: Estimated Creatinine Clearance: 138.4 mL/min (by C-G formula based on SCr of 0.71 mg/dL). Liver Function Tests:  Recent Labs Lab 04/22/16 1610  AST 47*  ALT 31  ALKPHOS 73  BILITOT 0.3  PROT 6.6  ALBUMIN 2.8*   No results for input(s): LIPASE, AMYLASE in the last 168 hours. No results for input(s): AMMONIA in the last 168 hours. Coagulation Profile: No results for input(s): INR, PROTIME in the last 168 hours. Cardiac Enzymes: No results for input(s): CKTOTAL, CKMB, CKMBINDEX,  TROPONINI in the last 168 hours. BNP (last 3 results) No results for input(s): PROBNP in the last 8760 hours. HbA1C: No results for input(s): HGBA1C in the last 72 hours. CBG: No results for input(s): GLUCAP in the last 168 hours. Lipid Profile: No results for input(s): CHOL, HDL, LDLCALC, TRIG, CHOLHDL, LDLDIRECT in the last 72 hours. Thyroid Function Tests: No results for input(s): TSH, T4TOTAL, FREET4, T3FREE, THYROIDAB in the last 72 hours. Anemia Panel: No results for input(s): VITAMINB12, FOLATE, FERRITIN, TIBC, IRON, RETICCTPCT in the last 72 hours. Sepsis Labs:  Recent Labs Lab 04/22/16 2115 04/23/16 0213  LATICACIDVEN 2.6* 1.9    Recent Results (from the past 240 hour(s))  Culture, blood (routine x 2)     Status: None (Preliminary result)   Collection Time: 04/22/16  9:07 PM  Result Value Ref Range Status   Specimen Description BLOOD LEFT ARM  Final   Special Requests BOTTLES DRAWN AEROBIC AND ANAEROBIC 5CC  Final   Culture NO GROWTH < 24 HOURS  Final   Report Status PENDING  Incomplete  Culture, blood (routine x 2)     Status: None (Preliminary result)   Collection Time: 04/22/16  9:15 PM  Result Value Ref Range Status   Specimen Description BLOOD LEFT ANTECUBITAL  Final   Special Requests BOTTLES DRAWN AEROBIC AND ANAEROBIC 5CC  Final   Culture NO GROWTH < 24 HOURS  Final   Report Status PENDING  Incomplete  Culture, Urine     Status: None   Collection Time: 04/22/16 10:30 PM  Result Value Ref Range Status   Specimen Description URINE, RANDOM  Final   Special Requests ADDED [id number]  Final   Culture NO GROWTH  Final   Report Status 04/24/2016 FINAL  Final  Respiratory Panel by PCR     Status: None   Collection Time: 04/23/16  2:05 PM  Result Value Ref Range Status   Adenovirus NOT DETECTED NOT DETECTED Final   Coronavirus 229E NOT DETECTED NOT DETECTED Final   Coronavirus HKU1 NOT DETECTED NOT DETECTED Final   Coronavirus NL63 NOT DETECTED NOT DETECTED  Final   Coronavirus OC43 NOT DETECTED NOT DETECTED Final   Metapneumovirus NOT DETECTED NOT DETECTED Final   Rhinovirus / Enterovirus NOT DETECTED NOT DETECTED Final   Influenza A NOT DETECTED NOT DETECTED Final   Influenza B NOT DETECTED NOT DETECTED Final   Parainfluenza Virus 1 NOT DETECTED NOT DETECTED Final   Parainfluenza Virus 2 NOT DETECTED NOT DETECTED Final   Parainfluenza Virus 3 NOT DETECTED NOT DETECTED Final   Parainfluenza Virus 4 NOT DETECTED NOT DETECTED Final   Respiratory Syncytial Virus NOT DETECTED NOT  DETECTED Final   Bordetella pertussis NOT DETECTED NOT DETECTED Final   Chlamydophila pneumoniae NOT DETECTED NOT DETECTED Final   Mycoplasma pneumoniae NOT DETECTED NOT DETECTED Final         Radiology Studies: Dg Chest 2 View  Result Date: 04/22/2016 CLINICAL DATA:  Swelling to phase for 1 week cellulitis EXAM: CHEST  2 VIEW COMPARISON:  None. FINDINGS: There are low lung volumes. There are tiny bilateral pleural effusions. Mild streaky bibasilar atelectasis. Heart size upper normal. Pulmonary vascularity within normal limits. No pneumothorax. IMPRESSION: 1. Low lung volumes with small bilateral effusions 2. Streaky bibasilar atelectasis Electronically Signed   By: Jasmine Pang M.D.   On: 04/22/2016 21:06        Scheduled Meds: . cefTRIAXone (ROCEPHIN)  IV  1 g Intravenous Q24H  . enoxaparin (LOVENOX) injection  40 mg Subcutaneous Q24H  . hydroxychloroquine  400 mg Oral Daily  . predniSONE  20 mg Oral BID WC   Continuous Infusions: . sodium chloride 125 mL/hr at 04/24/16 1129     LOS: 1 day      Coralie Keens, MD [facility] Pager [phone]  If 7PM-7AM, please contact night-coverage [email or web address] Password [email or web address] 04/24/2016, 12:21 PM

## 2016-04-25 ENCOUNTER — Inpatient Hospital Stay (HOSPITAL_COMMUNITY): Payer: BLUE CROSS/BLUE SHIELD

## 2016-04-25 DIAGNOSIS — M3500 Sicca syndrome, unspecified: Secondary | ICD-10-CM

## 2016-04-25 LAB — CBC WITH DIFFERENTIAL/PLATELET
BASOS ABS: 0 10*3/uL (ref 0.0–0.1)
Basophils Relative: 0 %
EOS PCT: 0 %
Eosinophils Absolute: 0 10*3/uL (ref 0.0–0.7)
HEMATOCRIT: 33.6 % — AB (ref 39.0–52.0)
Hemoglobin: 10.4 g/dL — ABNORMAL LOW (ref 13.0–17.0)
LYMPHS PCT: 15 %
Lymphs Abs: 0.3 10*3/uL — ABNORMAL LOW (ref 0.7–4.0)
MCH: 24.2 pg — ABNORMAL LOW (ref 26.0–34.0)
MCHC: 31 g/dL (ref 30.0–36.0)
MCV: 78.1 fL (ref 78.0–100.0)
MONO ABS: 0.2 10*3/uL (ref 0.1–1.0)
MONOS PCT: 11 %
NEUTROS ABS: 1.5 10*3/uL — AB (ref 1.7–7.7)
Neutrophils Relative %: 74 %
PLATELETS: 177 10*3/uL (ref 150–400)
RBC: 4.3 MIL/uL (ref 4.22–5.81)
RDW: 16.2 % — AB (ref 11.5–15.5)
WBC: 2 10*3/uL — ABNORMAL LOW (ref 4.0–10.5)

## 2016-04-25 LAB — BASIC METABOLIC PANEL
ANION GAP: 7 (ref 5–15)
BUN: 9 mg/dL (ref 6–20)
CALCIUM: 8.6 mg/dL — AB (ref 8.9–10.3)
CO2: 25 mmol/L (ref 22–32)
CREATININE: 0.68 mg/dL (ref 0.61–1.24)
Chloride: 107 mmol/L (ref 101–111)
GFR calc Af Amer: >60 mL/min (ref >60)
GLUCOSE: 107 mg/dL — AB (ref 65–99)
Potassium: 3.8 mmol/L (ref 3.5–5.1)
Sodium: 139 mmol/L (ref 135–145)

## 2016-04-25 LAB — SEDIMENTATION RATE: SED RATE: 50 mm/h — AB (ref 0–16)

## 2016-04-25 NOTE — Progress Notes (Signed)
PROGRESS NOTE    Marvin Wong  [id number] DOB: 11/29/1980 DOA: 04/22/2016 PCP: No PCP Per Patient    Brief Narrative:  35 yo male with lupus and sjogren's presents with facial and neck edema, high ds dna and low complement, admitted to rule out infection.    Assessment & Plan:   Principal Problem:   AKI (acute kidney injury) (HCC) Active Problems:   Essential hypertension   Sjogren's syndrome (HCC)   SLE (systemic lupus erythematosus) (HCC)   Anemia in other chronic diseases classified elsewhere   Facial swelling   1. Facial swelling.  No stridor or dyspnea, will get CT neck to rule out deep inflamation or infection. Will continue to hold on antibiotic therapy.  Noted still elevated sedimentation rate. Questionable steroid induced facilal swelling. Will check parotiditis serology.   2. SLE.  Pending repeat complement and ds dna, will continue prednisone and plaquenil for now. Will consider decreasing steroid dose if no signs of active lupus flare.   3. Sjogren's Clinically stable. No dysphagia or odinophagia.   4. AKI.  Resolved kidney injury, noted K at 3,8 and cr at 0.68, will continue to follow renal function as needed, patient tolerating po well.     DVT prophylaxis: enoxaparin  Code Status: full  Family Communication: I spoke with patient's family at the bedside and all questions were addressed. Disposition Plan: home    Consultants:    Procedures:   Antimicrobials:   Subjective: No dyspnea and no chest pain.   Objective: Vitals:   04/24/16 1349 04/24/16 1446 04/24/16 2201 04/25/16 0627  BP: 124/74 125/72 127/74 120/78  Pulse: 100 (!) 107 94 88  Resp: 20 16 20 16   Temp: 99.1 F (37.3 C) 99.7 F (37.6 C) 99.7 F (37.6 C) 98.6 F (37 C)  TempSrc: Oral Oral Oral Oral  SpO2: 100% 98% 99% 100%  Weight:      Height:        Intake/Output Summary (Last 24 hours) at 04/25/16 1250 Last data filed at 04/25/16 0715  Gross per 24 hour  Intake               220 ml  Output             2305 ml  Net            -2085 ml   Filed Weights   04/22/16 1601  Weight: 87.5 kg (193 lb)    Examination:  General exam: patient not in pain or dyspnea E ENT: no pallor or icterus. Oral mucosa moist. Non pitting edema neck in face.  Respiratory system: . Respiratory effort normal. No wheezing, rales or rhonchi.  Cardiovascular system: S1 & S2 heard, RRR. No JVD, murmurs, rubs, gallops or clicks. No pedal edema. Gastrointestinal system: Abdomen is nondistended, soft and nontender. No organomegaly or masses felt. Normal bowel sounds heard. Central nervous system: Alert and oriented. No focal neurological deficits. Extremities: no edema.  Skin: No rashes, lesions or ulcers     Data Reviewed: I have personally reviewed following labs and imaging studies  CBC:  Recent Labs Lab 04/22/16 1610 04/23/16 0213 04/24/16 0508 04/25/16 0635  WBC 4.9 2.7* 3.6* 2.0*  NEUTROABS  --   --  3.1 1.5*  HGB 9.9* 10.4* 9.9* 10.4*  HCT 32.7* 34.7* 31.4* 33.6*  MCV 80.1 79.8 78.3 78.1  PLT 210 192 188 177   Basic Metabolic Panel:  Recent Labs Lab 04/22/16 1610 04/23/16 0213 04/24/16 0508 04/25/16 0635  NA  139 139 137 139  K 3.7 4.1 4.4 3.8  CL 104 106 107 107  CO2 27 23 24 25   GLUCOSE 141* 85 130* 107*  BUN 14 17 12 9   CREATININE 1.61* 1.23 0.71 0.68  CALCIUM 8.5* 8.2* 8.2* 8.6*   GFR: Estimated Creatinine Clearance: 138.4 mL/min (by C-G formula based on SCr of 0.68 mg/dL). Liver Function Tests:  Recent Labs Lab 04/22/16 1610  AST 47*  ALT 31  ALKPHOS 73  BILITOT 0.3  PROT 6.6  ALBUMIN 2.8*   No results for input(s): LIPASE, AMYLASE in the last 168 hours. No results for input(s): AMMONIA in the last 168 hours. Coagulation Profile: No results for input(s): INR, PROTIME in the last 168 hours. Cardiac Enzymes: No results for input(s): CKTOTAL, CKMB, CKMBINDEX, TROPONINI in the last 168 hours. BNP (last 3 results) No results for  input(s): PROBNP in the last 8760 hours. HbA1C: No results for input(s): HGBA1C in the last 72 hours. CBG: No results for input(s): GLUCAP in the last 168 hours. Lipid Profile: No results for input(s): CHOL, HDL, LDLCALC, TRIG, CHOLHDL, LDLDIRECT in the last 72 hours. Thyroid Function Tests: No results for input(s): TSH, T4TOTAL, FREET4, T3FREE, THYROIDAB in the last 72 hours. Anemia Panel: No results for input(s): VITAMINB12, FOLATE, FERRITIN, TIBC, IRON, RETICCTPCT in the last 72 hours. Sepsis Labs:  Recent Labs Lab 04/22/16 2115 04/23/16 0213  LATICACIDVEN 2.6* 1.9    Recent Results (from the past 240 hour(s))  Culture, blood (routine x 2)     Status: None (Preliminary result)   Collection Time: 04/22/16  9:07 PM  Result Value Ref Range Status   Specimen Description BLOOD LEFT ARM  Final   Special Requests BOTTLES DRAWN AEROBIC AND ANAEROBIC 5CC  Final   Culture NO GROWTH 2 DAYS  Final   Report Status PENDING  Incomplete  Culture, blood (routine x 2)     Status: None (Preliminary result)   Collection Time: 04/22/16  9:15 PM  Result Value Ref Range Status   Specimen Description BLOOD LEFT ANTECUBITAL  Final   Special Requests BOTTLES DRAWN AEROBIC AND ANAEROBIC 5CC  Final   Culture NO GROWTH 2 DAYS  Final   Report Status PENDING  Incomplete  Culture, Urine     Status: None   Collection Time: 04/22/16 10:30 PM  Result Value Ref Range Status   Specimen Description URINE, RANDOM  Final   Special Requests ADDED [phone]  Final   Culture NO GROWTH  Final   Report Status 04/24/2016 FINAL  Final  Respiratory Panel by PCR     Status: None   Collection Time: 04/23/16  2:05 PM  Result Value Ref Range Status   Adenovirus NOT DETECTED NOT DETECTED Final   Coronavirus 229E NOT DETECTED NOT DETECTED Final   Coronavirus HKU1 NOT DETECTED NOT DETECTED Final   Coronavirus NL63 NOT DETECTED NOT DETECTED Final   Coronavirus OC43 NOT DETECTED NOT DETECTED Final   Metapneumovirus  NOT DETECTED NOT DETECTED Final   Rhinovirus / Enterovirus NOT DETECTED NOT DETECTED Final   Influenza A NOT DETECTED NOT DETECTED Final   Influenza B NOT DETECTED NOT DETECTED Final   Parainfluenza Virus 1 NOT DETECTED NOT DETECTED Final   Parainfluenza Virus 2 NOT DETECTED NOT DETECTED Final   Parainfluenza Virus 3 NOT DETECTED NOT DETECTED Final   Parainfluenza Virus 4 NOT DETECTED NOT DETECTED Final   Respiratory Syncytial Virus NOT DETECTED NOT DETECTED Final   Bordetella pertussis NOT DETECTED NOT  DETECTED Final   Chlamydophila pneumoniae NOT DETECTED NOT DETECTED Final   Mycoplasma pneumoniae NOT DETECTED NOT DETECTED Final         Radiology Studies: No results found.      Scheduled Meds: . enoxaparin (LOVENOX) injection  40 mg Subcutaneous Q24H  . hydroxychloroquine  400 mg Oral Daily  . predniSONE  20 mg Oral BID WC   Continuous Infusions:   LOS: 2 days        Mauricio Annett Gula, MD [facility] Pager [phone]  If 7PM-7AM, please contact night-coverage [email or web address] Password [email or web address] 04/25/2016, 12:50 PM

## 2016-04-26 LAB — CBC WITH DIFFERENTIAL/PLATELET
Basophils Absolute: 0 10*3/uL (ref 0.0–0.1)
Basophils Relative: 0 %
EOS PCT: 0 %
Eosinophils Absolute: 0 10*3/uL (ref 0.0–0.7)
HCT: 32.3 % — ABNORMAL LOW (ref 39.0–52.0)
HEMOGLOBIN: 10 g/dL — AB (ref 13.0–17.0)
LYMPHS ABS: 0.4 10*3/uL — AB (ref 0.7–4.0)
LYMPHS PCT: 16 %
MCH: 24.4 pg — AB (ref 26.0–34.0)
MCHC: 31 g/dL (ref 30.0–36.0)
MCV: 79 fL (ref 78.0–100.0)
MONOS PCT: 13 %
Monocytes Absolute: 0.3 10*3/uL (ref 0.1–1.0)
NEUTROS PCT: 71 %
Neutro Abs: 1.7 10*3/uL (ref 1.7–7.7)
Platelets: 183 10*3/uL (ref 150–400)
RBC: 4.09 MIL/uL — AB (ref 4.22–5.81)
RDW: 15.8 % — ABNORMAL HIGH (ref 11.5–15.5)
WBC: 2.4 10*3/uL — AB (ref 4.0–10.5)

## 2016-04-26 LAB — CK: Total CK: 31 U/L — ABNORMAL LOW (ref 49–397)

## 2016-04-26 LAB — C4 COMPLEMENT: Complement C4, Body Fluid: 7 mg/dL — ABNORMAL LOW (ref 14–44)

## 2016-04-26 LAB — ANTI-DNA ANTIBODY, DOUBLE-STRANDED: ds DNA Ab: >300 IU/mL — ABNORMAL HIGH (ref 0–9)

## 2016-04-26 LAB — C3 COMPLEMENT: C3 COMPLEMENT: 50 mg/dL — AB (ref 82–167)

## 2016-04-26 NOTE — Progress Notes (Addendum)
PROGRESS NOTE    Marvin Wong  ZOX:[id number] DOB: 1980/10/18 DOA: 04/22/2016 PCP: No PCP Per Patient .   Brief Narrative:  35 yo male with lupus and sjogren's presents with facial and neck edema, high ds dna and low complement, admitted to rule out infection vs lupus flare. Elevated ds DNA and low complement, ct neck with no deep infection. Follow cpk, if elevation may suggest lupus related myositis.    Assessment & Plan:   Principal Problem:   AKI (acute kidney injury) (HCC) Active Problems:   Essential hypertension   Sjogren's syndrome (HCC)   SLE (systemic lupus erythematosus) (HCC)   Anemia in other chronic diseases classified elsewhere   Facial swelling  1. Facial and neck swelling.  CT with subcutaneous fat edema and platysma thickening. Suspected steroid induced but need to rule out myositis related to active lupus. No clinical signs of airway compromise. Tolerating po well. Will follow on CPK. I spoke with [facility] rheumatology, will continue to monitor temperature curve, will do blood cultures and urine protein/ creatinine. It is possible that patient has baseline low complement. Will contact patient's rehumatology Dr Octaviano Glow on Monday.   2. SLE. Serum complement still low, will follow on ds DNA, will continue plaquenil and prednisone at current dose, need rule out myositis due to lupus or steroid induced.   3. Sjogren's Clinically stable. No dysphagia or odinophagia. Follow on mumps serologies. Noted bilateral sialolithiasis.   4. AKI.  Patient tolerating po well, no nausea or vomiting.   DVT prophylaxis:enoxaparin  Code Status:full  Family Communication:I spoke with patient's family at the bedside and all questions were addressed. Disposition Plan:home    Consultants:    Procedures:   Antimicrobials:    Subjective: Patient has noted improvement of neck and face edema, no dyspnea or dysphagia. No fever or chills.   Objective: Vitals:   04/24/16 2201  04/25/16 0627 04/25/16 2100 04/26/16 0536  BP: 127/74 120/78 112/67 109/62  Pulse: 94 88 85 83  Resp: 20 16 18 18   Temp: 99.7 F (37.6 C) 98.6 F (37 C) 97.7 F (36.5 C) 98.1 F (36.7 C)  TempSrc: Oral Oral Oral Oral  SpO2: 99% 100% 100% 100%  Weight:      Height:        Intake/Output Summary (Last 24 hours) at 04/26/16 1154 Last data filed at 04/26/16 1100  Gross per 24 hour  Intake                0 ml  Output              325 ml  Net             -325 ml   Filed Weights   04/22/16 1601  Weight: 87.5 kg (193 lb)    Examination:  General exam: Not in pain or dyspnea E ENT: mild pallor but no icterus, oral mucosa moist, no ulcers. Persistent non pitting edema of neck and face with mild improvement.  Respiratory system: Clear to auscultation. Respiratory effort normal. No wheezing, rales or rhonchi. Cardiovascular system: S1 & S2 heard, RRR. No JVD, murmurs, rubs, gallops or clicks. No pedal edema. Gastrointestinal system: Abdomen is nondistended, soft and nontender. No organomegaly or masses felt. Normal bowel sounds heard. Central nervous system: Alert and oriented. No focal neurological deficits. Extremities: Symmetric 5 x 5 power. Skin: No rashes, lesions or ulcers     Data Reviewed: I have personally reviewed following labs and imaging studies  CBC:  Recent Labs Lab 04/22/16 1610 04/23/16 0213 04/24/16 0508 04/25/16 0635 04/26/16 0524  WBC 4.9 2.7* 3.6* 2.0* 2.4*  NEUTROABS  --   --  3.1 1.5* 1.7  HGB 9.9* 10.4* 9.9* 10.4* 10.0*  HCT 32.7* 34.7* 31.4* 33.6* 32.3*  MCV 80.1 79.8 78.3 78.1 79.0  PLT 210 192 188 177 183   Basic Metabolic Panel:  Recent Labs Lab 04/22/16 1610 04/23/16 0213 04/24/16 0508 04/25/16 0635  NA 139 139 137 139  K 3.7 4.1 4.4 3.8  CL 104 106 107 107  CO2 27 23 24 25   GLUCOSE 141* 85 130* 107*  BUN 14 17 12 9   CREATININE 1.61* 1.23 0.71 0.68  CALCIUM 8.5* 8.2* 8.2* 8.6*   GFR: Estimated Creatinine Clearance: 138.4  mL/min (by C-G formula based on SCr of 0.68 mg/dL). Liver Function Tests:  Recent Labs Lab 04/22/16 1610  AST 47*  ALT 31  ALKPHOS 73  BILITOT 0.3  PROT 6.6  ALBUMIN 2.8*   No results for input(s): LIPASE, AMYLASE in the last 168 hours. No results for input(s): AMMONIA in the last 168 hours. Coagulation Profile: No results for input(s): INR, PROTIME in the last 168 hours. Cardiac Enzymes: No results for input(s): CKTOTAL, CKMB, CKMBINDEX, TROPONINI in the last 168 hours. BNP (last 3 results) No results for input(s): PROBNP in the last 8760 hours. HbA1C: No results for input(s): HGBA1C in the last 72 hours. CBG: No results for input(s): GLUCAP in the last 168 hours. Lipid Profile: No results for input(s): CHOL, HDL, LDLCALC, TRIG, CHOLHDL, LDLDIRECT in the last 72 hours. Thyroid Function Tests: No results for input(s): TSH, T4TOTAL, FREET4, T3FREE, THYROIDAB in the last 72 hours. Anemia Panel: No results for input(s): VITAMINB12, FOLATE, FERRITIN, TIBC, IRON, RETICCTPCT in the last 72 hours. Sepsis Labs:  Recent Labs Lab 04/22/16 2115 04/23/16 0213  LATICACIDVEN 2.6* 1.9    Recent Results (from the past 240 hour(s))  Culture, blood (routine x 2)     Status: None (Preliminary result)   Collection Time: 04/22/16  9:07 PM  Result Value Ref Range Status   Specimen Description BLOOD LEFT ARM  Final   Special Requests BOTTLES DRAWN AEROBIC AND ANAEROBIC 5CC  Final   Culture NO GROWTH 4 DAYS  Final   Report Status PENDING  Incomplete  Culture, blood (routine x 2)     Status: None (Preliminary result)   Collection Time: 04/22/16  9:15 PM  Result Value Ref Range Status   Specimen Description BLOOD LEFT ANTECUBITAL  Final   Special Requests BOTTLES DRAWN AEROBIC AND ANAEROBIC 5CC  Final   Culture NO GROWTH 4 DAYS  Final   Report Status PENDING  Incomplete  Culture, Urine     Status: None   Collection Time: 04/22/16 10:30 PM  Result Value Ref Range Status   Specimen  Description URINE, RANDOM  Final   Special Requests ADDED [id number]  Final   Culture NO GROWTH  Final   Report Status 04/24/2016 FINAL  Final  Respiratory Panel by PCR     Status: None   Collection Time: 04/23/16  2:05 PM  Result Value Ref Range Status   Adenovirus NOT DETECTED NOT DETECTED Final   Coronavirus 229E NOT DETECTED NOT DETECTED Final   Coronavirus HKU1 NOT DETECTED NOT DETECTED Final   Coronavirus NL63 NOT DETECTED NOT DETECTED Final   Coronavirus OC43 NOT DETECTED NOT DETECTED Final   Metapneumovirus NOT DETECTED NOT DETECTED Final   Rhinovirus / Enterovirus NOT DETECTED  NOT DETECTED Final   Influenza A NOT DETECTED NOT DETECTED Final   Influenza B NOT DETECTED NOT DETECTED Final   Parainfluenza Virus 1 NOT DETECTED NOT DETECTED Final   Parainfluenza Virus 2 NOT DETECTED NOT DETECTED Final   Parainfluenza Virus 3 NOT DETECTED NOT DETECTED Final   Parainfluenza Virus 4 NOT DETECTED NOT DETECTED Final   Respiratory Syncytial Virus NOT DETECTED NOT DETECTED Final   Bordetella pertussis NOT DETECTED NOT DETECTED Final   Chlamydophila pneumoniae NOT DETECTED NOT DETECTED Final   Mycoplasma pneumoniae NOT DETECTED NOT DETECTED Final         Radiology Studies: Ct Soft Tissue Neck Wo Contrast  Result Date: 04/25/2016 CLINICAL DATA:  Bilateral neck swelling. Symptoms began last Monday. EXAM: CT NECK WITHOUT CONTRAST TECHNIQUE: Multidetector CT imaging of the neck was performed following the standard protocol without intravenous contrast. COMPARISON:  10/16/2014 face CT FINDINGS: Pharynx and larynx: Negative for mass or submucosal edema. Salivary glands: Multiple calculi in the left parotid and to a lesser extent the right parotid. No primary gland inflammation is noted. Small submandibular glands. Thyroid: Negative Lymph nodes: No concerning enlargement.  No abnormal density. Vascular: Negative Limited intracranial: Negative Visualized orbits: Negative Mastoids and visualized  paranasal sinuses: Clear Skeleton: No acute or aggressive finding. Upper chest: No acute finding. Other: Symmetric nonspecific subcutaneous fat edema and platysma thickening. Similar changes were seen on 2016 maxillofacial CT, but increased today. If no infectious symptoms, question relationship to lupus. Angioedema is also considered. No opaque foreign body or soft tissue emphysema. No airway mass-effect. IMPRESSION: 1. Symmetric facial and upper neck edema similar to 2016, although greater today. No deep or airway inflammatory process is noted. Further discussion above. 2. Bilateral parotid sialolithiasis. Electronically Signed   By: Marnee SpringJonathon  Watts M.D.   On: 04/25/2016 16:26        Scheduled Meds: . enoxaparin (LOVENOX) injection  40 mg Subcutaneous Q24H  . hydroxychloroquine  400 mg Oral Daily  . predniSONE  20 mg Oral BID WC   Continuous Infusions:   LOS: 3 days        Demareon Coldwell Annett Gulaaniel Maisyn Nouri, MD [facility] Pager [phone]  If 7PM-7AM, please contact night-coverage [email or web address] Password [email or web address] 04/26/2016, 11:54 AM

## 2016-04-27 DIAGNOSIS — I10 Essential (primary) hypertension: Secondary | ICD-10-CM

## 2016-04-27 DIAGNOSIS — T783XXA Angioneurotic edema, initial encounter: Secondary | ICD-10-CM

## 2016-04-27 LAB — CBC WITH DIFFERENTIAL/PLATELET
BASOS PCT: 1 %
Basophils Absolute: 0 10*3/uL (ref 0.0–0.1)
EOS ABS: 0 10*3/uL (ref 0.0–0.7)
EOS PCT: 0 %
HCT: 33 % — ABNORMAL LOW (ref 39.0–52.0)
Hemoglobin: 10.2 g/dL — ABNORMAL LOW (ref 13.0–17.0)
LYMPHS ABS: 0.3 10*3/uL — AB (ref 0.7–4.0)
Lymphocytes Relative: 15 %
MCH: 24.1 pg — AB (ref 26.0–34.0)
MCHC: 30.9 g/dL (ref 30.0–36.0)
MCV: 78 fL (ref 78.0–100.0)
MONO ABS: 0.2 10*3/uL (ref 0.1–1.0)
MONOS PCT: 10 %
NEUTROS PCT: 74 %
Neutro Abs: 1.6 10*3/uL — ABNORMAL LOW (ref 1.7–7.7)
PLATELETS: 215 10*3/uL (ref 150–400)
RBC: 4.23 MIL/uL (ref 4.22–5.81)
RDW: 15.7 % — AB (ref 11.5–15.5)
WBC: 2.1 10*3/uL — ABNORMAL LOW (ref 4.0–10.5)

## 2016-04-27 LAB — BASIC METABOLIC PANEL
Anion gap: 9 (ref 5–15)
BUN: 10 mg/dL (ref 6–20)
CALCIUM: 8.6 mg/dL — AB (ref 8.9–10.3)
CO2: 24 mmol/L (ref 22–32)
CREATININE: 0.67 mg/dL (ref 0.61–1.24)
Chloride: 104 mmol/L (ref 101–111)
GFR calc non Af Amer: >60 mL/min (ref >60)
Glucose, Bld: 113 mg/dL — ABNORMAL HIGH (ref 65–99)
Potassium: 3.9 mmol/L (ref 3.5–5.1)
SODIUM: 137 mmol/L (ref 135–145)

## 2016-04-27 LAB — CULTURE, BLOOD (ROUTINE X 2)
CULTURE: NO GROWTH
Culture: NO GROWTH

## 2016-04-27 LAB — CALCIUM / CREATININE RATIO, URINE
Calcium, Ur: <0.8 mg/dL
Creatinine, Urine: 11.5 mg/dL

## 2016-04-27 NOTE — Progress Notes (Signed)
PROGRESS NOTE    Marvin Wong  ZOX:[id number] DOB: 01/03/1981 DOA: 04/22/2016 PCP: No PCP Per Patient    Brief Narrative:  35 yo male with lupus and sjogren's presents with facial and neck edema, high ds dna and low complement, admitted to rule out infection vs lupus flare. Elevated ds DNA and low complement, ct neck with no deep infection. Normal CPK suggesting no myositis. Case reviewed with rheumatology over the phone, patient can have chronic low complement and high ds dna at baseline.    Assessment & Plan:   Principal Problem:   AKI (acute kidney injury) (HCC) Active Problems:   Essential hypertension   Sjogren's syndrome (HCC)   SLE (systemic lupus erythematosus) (HCC)   Anemia in other chronic diseases classified elsewhere   Facial swelling    1. Facial and neck swelling suspected angioedema. Clinically the edema has been improving, dose of prednisone has remain the same, no signs of deep infection, will continue to monitor temperature curve, t max 101 on 11/11 at 1408. Cultures no growth, will plan to discharge patient in am if afebrile for 24 hours.   2. SLE. Clinically no signs of flare, serologies still abnormal, but likely chronically abnormal in his case, will need to check base line markers from the outpatient clinic. Will keep current dose of steroids for now, patient clinically improving, no signs of myositis, norma, cpk. Continue plaquenil.   3. Sjogren's Clinically stable. No dysphagia or odinophagia. Follow on mumps serologies. Noted bilateral sialolithiasis.   4. AKI. Renal function with cr at 0.67 with K at 3,9, patient tolerating po well. Normal urine protein creatinine ration.   DVT prophylaxis:enoxaparin  Code Status:full  Family Communication:I spoke with patient's family at the bedside and all questions were addressed. Disposition Plan:home    Consultants: Over the phone consulted Rheumatology at [facility].    Procedures:  Antimicrobials:    Subjective: Patient had a fever yesterday that held his discharge, cultures have been obtained. Patient noted improving of facial and neck edema, no joint pain, no dyspnea, no chest pain, no rashes.   Objective: Vitals:   04/26/16 1439 04/26/16 1715 04/26/16 2228 04/27/16 0524  BP:   108/66 114/65  Pulse:   88 95  Resp:   18 18  Temp: (!) 100.9 F (38.3 C) 99.7 F (37.6 C) 97.6 F (36.4 C) 97.7 F (36.5 C)  TempSrc: Axillary Oral Oral Oral  SpO2:   100% 100%  Weight:      Height:        Intake/Output Summary (Last 24 hours) at 04/27/16 1133 Last data filed at 04/27/16 1000  Gross per 24 hour  Intake              360 ml  Output             1750 ml  Net            -1390 ml   Filed Weights   04/22/16 1601  Weight: 87.5 kg (193 lb)    Examination:  General exam: not in pain or dyspnea E ENT. No pallor or icterus. Neck and facial edema, improving, no erythema.  Respiratory system: Clear to auscultation. Respiratory effort normal. No wheezing, rales or rhonchi.  Cardiovascular system: S1 & S2 heard, RRR. No JVD, murmurs, rubs, gallops or clicks. No pedal edema. Gastrointestinal system: Abdomen is nondistended, soft and nontender. No organomegaly or masses felt. Normal bowel sounds heard. Central nervous system: Alert and oriented. No focal neurological deficits. Extremities:  Symmetric 5 x 5 power. Skin: No rashes, lesions or ulcers      Data Reviewed: I have personally reviewed following labs and imaging studies  CBC:  Recent Labs Lab 04/23/16 0213 04/24/16 0508 04/25/16 0635 04/26/16 0524 04/27/16 0551  WBC 2.7* 3.6* 2.0* 2.4* 2.1*  NEUTROABS  --  3.1 1.5* 1.7 1.6*  HGB 10.4* 9.9* 10.4* 10.0* 10.2*  HCT 34.7* 31.4* 33.6* 32.3* 33.0*  MCV 79.8 78.3 78.1 79.0 78.0  PLT 192 188 177 183 215   Basic Metabolic Panel:  Recent Labs Lab 04/22/16 1610 04/23/16 0213 04/24/16 0508 04/25/16 0635 04/27/16 0551  NA 139 139 137 139 137  K 3.7 4.1 4.4 3.8 3.9   CL 104 106 107 107 104  CO2 27 23 24 25 24   GLUCOSE 141* 85 130* 107* 113*  BUN 14 17 12 9 10   CREATININE 1.61* 1.23 0.71 0.68 0.67  CALCIUM 8.5* 8.2* 8.2* 8.6* 8.6*   GFR: Estimated Creatinine Clearance: 138.4 mL/min (by C-G formula based on SCr of 0.67 mg/dL). Liver Function Tests:  Recent Labs Lab 04/22/16 1610  AST 47*  ALT 31  ALKPHOS 73  BILITOT 0.3  PROT 6.6  ALBUMIN 2.8*   No results for input(s): LIPASE, AMYLASE in the last 168 hours. No results for input(s): AMMONIA in the last 168 hours. Coagulation Profile: No results for input(s): INR, PROTIME in the last 168 hours. Cardiac Enzymes:  Recent Labs Lab 04/26/16 1106  CKTOTAL 31*   BNP (last 3 results) No results for input(s): PROBNP in the last 8760 hours. HbA1C: No results for input(s): HGBA1C in the last 72 hours. CBG: No results for input(s): GLUCAP in the last 168 hours. Lipid Profile: No results for input(s): CHOL, HDL, LDLCALC, TRIG, CHOLHDL, LDLDIRECT in the last 72 hours. Thyroid Function Tests: No results for input(s): TSH, T4TOTAL, FREET4, T3FREE, THYROIDAB in the last 72 hours. Anemia Panel: No results for input(s): VITAMINB12, FOLATE, FERRITIN, TIBC, IRON, RETICCTPCT in the last 72 hours. Sepsis Labs:  Recent Labs Lab 04/22/16 2115 04/23/16 0213  LATICACIDVEN 2.6* 1.9    Recent Results (from the past 240 hour(s))  Culture, blood (routine x 2)     Status: None (Preliminary result)   Collection Time: 04/22/16  9:07 PM  Result Value Ref Range Status   Specimen Description BLOOD LEFT ARM  Final   Special Requests BOTTLES DRAWN AEROBIC AND ANAEROBIC 5CC  Final   Culture NO GROWTH 4 DAYS  Final   Report Status PENDING  Incomplete  Culture, blood (routine x 2)     Status: None (Preliminary result)   Collection Time: 04/22/16  9:15 PM  Result Value Ref Range Status   Specimen Description BLOOD LEFT ANTECUBITAL  Final   Special Requests BOTTLES DRAWN AEROBIC AND ANAEROBIC 5CC  Final    Culture NO GROWTH 4 DAYS  Final   Report Status PENDING  Incomplete  Culture, Urine     Status: None   Collection Time: 04/22/16 10:30 PM  Result Value Ref Range Status   Specimen Description URINE, RANDOM  Final   Special Requests ADDED [id number]  Final   Culture NO GROWTH  Final   Report Status 04/24/2016 FINAL  Final  Respiratory Panel by PCR     Status: None   Collection Time: 04/23/16  2:05 PM  Result Value Ref Range Status   Adenovirus NOT DETECTED NOT DETECTED Final   Coronavirus 229E NOT DETECTED NOT DETECTED Final   Coronavirus HKU1 NOT DETECTED  NOT DETECTED Final   Coronavirus NL63 NOT DETECTED NOT DETECTED Final   Coronavirus OC43 NOT DETECTED NOT DETECTED Final   Metapneumovirus NOT DETECTED NOT DETECTED Final   Rhinovirus / Enterovirus NOT DETECTED NOT DETECTED Final   Influenza A NOT DETECTED NOT DETECTED Final   Influenza B NOT DETECTED NOT DETECTED Final   Parainfluenza Virus 1 NOT DETECTED NOT DETECTED Final   Parainfluenza Virus 2 NOT DETECTED NOT DETECTED Final   Parainfluenza Virus 3 NOT DETECTED NOT DETECTED Final   Parainfluenza Virus 4 NOT DETECTED NOT DETECTED Final   Respiratory Syncytial Virus NOT DETECTED NOT DETECTED Final   Bordetella pertussis NOT DETECTED NOT DETECTED Final   Chlamydophila pneumoniae NOT DETECTED NOT DETECTED Final   Mycoplasma pneumoniae NOT DETECTED NOT DETECTED Final         Radiology Studies: Ct Soft Tissue Neck Wo Contrast  Result Date: 04/25/2016 CLINICAL DATA:  Bilateral neck swelling. Symptoms began last Monday. EXAM: CT NECK WITHOUT CONTRAST TECHNIQUE: Multidetector CT imaging of the neck was performed following the standard protocol without intravenous contrast. COMPARISON:  10/16/2014 face CT FINDINGS: Pharynx and larynx: Negative for mass or submucosal edema. Salivary glands: Multiple calculi in the left parotid and to a lesser extent the right parotid. No primary gland inflammation is noted. Small submandibular  glands. Thyroid: Negative Lymph nodes: No concerning enlargement.  No abnormal density. Vascular: Negative Limited intracranial: Negative Visualized orbits: Negative Mastoids and visualized paranasal sinuses: Clear Skeleton: No acute or aggressive finding. Upper chest: No acute finding. Other: Symmetric nonspecific subcutaneous fat edema and platysma thickening. Similar changes were seen on 2016 maxillofacial CT, but increased today. If no infectious symptoms, question relationship to lupus. Angioedema is also considered. No opaque foreign body or soft tissue emphysema. No airway mass-effect. IMPRESSION: 1. Symmetric facial and upper neck edema similar to 2016, although greater today. No deep or airway inflammatory process is noted. Further discussion above. 2. Bilateral parotid sialolithiasis. Electronically Signed   By: Marnee SpringJonathon  Watts M.D.   On: 04/25/2016 16:26        Scheduled Meds: . enoxaparin (LOVENOX) injection  40 mg Subcutaneous Q24H  . hydroxychloroquine  400 mg Oral Daily  . predniSONE  20 mg Oral BID WC   Continuous Infusions:   LOS: 4 days        Wynell Halberg Annett Gulaaniel Shelly Spenser, MD [facility] Pager [phone]  If 7PM-7AM, please contact night-coverage [email or web address] Password [email or web address] 04/27/2016, 11:33 AM

## 2016-04-28 MED ORDER — PREDNISONE 20 MG PO TABS: 20.0000 mg | ORAL_TABLET | Freq: Two times a day (BID) | ORAL | 0 refills | Status: AC

## 2016-04-28 NOTE — Progress Notes (Signed)
Pt given discharge instructions, prescriptions, and care notes. Pt verbalized understanding AEB no further questions or concerns at this time. IV was discontinued, no redness, pain, or swelling noted at this time. Pt left the floor via wheelchair with staff in stable condition. 

## 2016-04-28 NOTE — Discharge Summary (Addendum)
Physician Discharge Summary  Marvin Wong ZOX:[id number] DOB: [birth year] DOA: 04/22/2016  PCP: No PCP Per Patient  Admit date: 04/22/2016 Discharge date: 04/28/2016  Admitted From:  Home  Disposition:   Home   Recommendations for Outpatient Follow-up:  1. Follow up with PCP in 1-week 2. Follow with Rheumatology Dr Octaviano Glow on 11/15 3. Continue prednisone 20 mg po bid.  4. Mumps serologies are pending  Home Health: No   Equipment/Devices: No    Discharge Condition: Stable CODE STATUS: Full  Diet recommendation: Heart Healthy   Brief/Interim Summary: This is a 35 year old male who presented to hospital with the chief complaint of facial swelling and fevers. His symptoms have been present for about 7 days, the edema waxed and waned for a few days prior to admission, but 24 hours before presentation to the hospital his neck became more swollen and prompted him to seek medical attention. Denied any rash or joint pain. On initial physical examination his blood pressure was 105/74, heart rate 90, respiratory rate 18, temperature 98.2. He was awake and alert, he is mucous members were moist, his lungs were clear to auscultation, heart S1-S2 present rhythmic, abdomen soft, he had fullness of his neck and face without erythema. Sodium 139, potassium 3.7, nightly 14, creatinine 1.61, glucose 141, normal LFTs, white count 4.9, hemoglobin 9.9, hematocrit 32.7, platelets 210, lactic acid 2.6, CRP 3.7, sed rate 49, double-stranded DNA antibodies more than 300, complement C3 48, C4 6, urine analysis with oxalate crystals, proteins 100, white cells 6-30, hyaline casts, RBCs 0-5, negative hemoglobin. His chest film was hypoinflated with small bibasilar pleural effusions.   Patient was admitted to the hospital with working diagnosis of acute kidney injury complicated with facial and neck edema to rule out cellulitis.  1. Acute kidney injury. Patient was placed on intravenous fluids, supportive care. Kidney  function improved with further normalization of creatinine. Discharge creatinine 0.67.   2. Facial and neck edema, suspected angioedema. Patient was maintained on prednisone 20 mg twice daily and plaquenil. Patient's double-stranded DNA remain high, serum complements remain low. Soft tissue CT scan of the neck show symmetric facial and upper neck edema similar to 2016, although greater on this examination. In the differential was mention changes related to lupus or angioedema. Clinically patient did not have any signs of active lupus. Rheumatology with [facility] was consulted over the phone, noted that patients with lupus can have chronic low complement and chronic high double-stranded DNA. Recommended conservative therapy and follow-up with patient's rheumatologist. Over the course of his hospitalization edema improved remarkably, no signs of airway compromise. It is presumed that edema is due to angioedema and not a lupus reactivation. Serum CPK 31. His viral panel was negative, mumps serologies are pending. Patient had IV ceftriaxone for 2 days.   3. Hypertension. Patient's blood pressure remained stable, well controlled off antihypertensive medication. Systolic blood pressure 110-1 20 mmHg. Due to risk of angioedema with nebivolol this agent will be discontinued. For now continue outpatient follow up of blood pressure.   Discharge Diagnoses:  Principal Problem:   AKI (acute kidney injury) (HCC) Active Problems:   Essential hypertension   Sjogren's syndrome (HCC)   SLE (systemic lupus erythematosus) (HCC)   Anemia in other chronic diseases classified elsewhere   Facial swelling    Discharge Instructions  Discharge Instructions    Diet - low sodium heart healthy    Complete by:  As directed    Discharge instructions    Complete by:  As directed  Please follow with Rheumatologist as scheduled on 04/30/16   Increase activity slowly    Complete by:  As directed        Medication List     STOP taking these medications   nebivolol 5 MG tablet Commonly known as:  BYSTOLIC     TAKE these medications   hydroxychloroquine 200 MG tablet Commonly known as:  PLAQUENIL Take 400 mg by mouth daily.   predniSONE 20 MG tablet Commonly known as:  DELTASONE Take 1 tablet (20 mg total) by mouth 2 (two) times daily with a meal. What changed:  medication strength  how much to take  when to take this      Follow-up Information    Dr Octaviano GlowShroff Follow up.   Why:  On 04/30/2016         Allergies  Allergen Reactions  . Amoxicillin-Pot Clavulanate Swelling  . Penicillins Swelling  . Sulfamethoxazole-Trimethoprim Nausea And Vomiting  . Ivp Dye [Iodinated Diagnostic Agents]   . Levaquin [Levofloxacin]   . Sulfa Antibiotics     Consultations:  [facility]    Procedures/Studies: Dg Chest 2 View  Result Date: 04/22/2016 CLINICAL DATA:  Swelling to phase for 1 week cellulitis EXAM: CHEST  2 VIEW COMPARISON:  None. FINDINGS: There are low lung volumes. There are tiny bilateral pleural effusions. Mild streaky bibasilar atelectasis. Heart size upper normal. Pulmonary vascularity within normal limits. No pneumothorax. IMPRESSION: 1. Low lung volumes with small bilateral effusions 2. Streaky bibasilar atelectasis Electronically Signed   By: Jasmine PangKim  Fujinaga M.D.   On: 04/22/2016 21:06   Ct Soft Tissue Neck Wo Contrast  Result Date: 04/25/2016 CLINICAL DATA:  Bilateral neck swelling. Symptoms began last Monday. EXAM: CT NECK WITHOUT CONTRAST TECHNIQUE: Multidetector CT imaging of the neck was performed following the standard protocol without intravenous contrast. COMPARISON:  10/16/2014 face CT FINDINGS: Pharynx and larynx: Negative for mass or submucosal edema. Salivary glands: Multiple calculi in the left parotid and to a lesser extent the right parotid. No primary gland inflammation is noted. Small submandibular glands. Thyroid: Negative Lymph  nodes: No concerning enlargement.  No abnormal density. Vascular: Negative Limited intracranial: Negative Visualized orbits: Negative Mastoids and visualized paranasal sinuses: Clear Skeleton: No acute or aggressive finding. Upper chest: No acute finding. Other: Symmetric nonspecific subcutaneous fat edema and platysma thickening. Similar changes were seen on 2016 maxillofacial CT, but increased today. If no infectious symptoms, question relationship to lupus. Angioedema is also considered. No opaque foreign body or soft tissue emphysema. No airway mass-effect. IMPRESSION: 1. Symmetric facial and upper neck edema similar to 2016, although greater today. No deep or airway inflammatory process is noted. Further discussion above. 2. Bilateral parotid sialolithiasis. Electronically Signed   By: Marnee SpringJonathon  Watts M.D.   On: 04/25/2016 16:26     Subjective: Patient feeling better, neck and face close to baseline, no dysphagia or dyspnea. No nausea or vomiting, no joint or abdominal pain.   Discharge Exam: Vitals:   04/28/16 0536 04/28/16 1029  BP: 117/72 119/67  Pulse: 82 96  Resp: 17 18  Temp: 97.9 F (36.6 C) 99.4 F (37.4 C)   Vitals:   04/27/16 1528 04/27/16 2250 04/28/16 0536 04/28/16 1029  BP: 118/67 113/74 117/72 119/67  Pulse: 92 77 82 96  Resp: 19 17 17 18   Temp: 98.1 F (36.7 C) 97.3 F (36.3 C) 97.9 F (36.6 C) 99.4 F (37.4 C)  TempSrc: Oral Oral Oral Oral  SpO2: 100% 100%  100% 100%  Weight:      Height:        General: Pt is alert, awake, not in acute distress Face and neck with significant improving of edema, no stridor, no erythema Cardiovascular: RRR, S1/S2 +, no rubs, no gallops Respiratory: CTA bilaterally, no wheezing, no rhonchi Abdominal: Soft, NT, ND, bowel sounds + Extremities: no edema, no cyanosis    The results of significant diagnostics from this hospitalization (including imaging, microbiology, ancillary and laboratory) are listed below for reference.      Microbiology: Recent Results (from the past 240 hour(s))  Culture, blood (routine x 2)     Status: None   Collection Time: 04/22/16  9:07 PM  Result Value Ref Range Status   Specimen Description BLOOD LEFT ARM  Final   Special Requests BOTTLES DRAWN AEROBIC AND ANAEROBIC 5CC  Final   Culture NO GROWTH 5 DAYS  Final   Report Status 04/27/2016 FINAL  Final  Culture, blood (routine x 2)     Status: None   Collection Time: 04/22/16  9:15 PM  Result Value Ref Range Status   Specimen Description BLOOD LEFT ANTECUBITAL  Final   Special Requests BOTTLES DRAWN AEROBIC AND ANAEROBIC 5CC  Final   Culture NO GROWTH 5 DAYS  Final   Report Status 04/27/2016 FINAL  Final  Culture, Urine     Status: None   Collection Time: 04/22/16 10:30 PM  Result Value Ref Range Status   Specimen Description URINE, RANDOM  Final   Special Requests ADDED [id number]  Final   Culture NO GROWTH  Final   Report Status 04/24/2016 FINAL  Final  Respiratory Panel by PCR     Status: None   Collection Time: 04/23/16  2:05 PM  Result Value Ref Range Status   Adenovirus NOT DETECTED NOT DETECTED Final   Coronavirus 229E NOT DETECTED NOT DETECTED Final   Coronavirus HKU1 NOT DETECTED NOT DETECTED Final   Coronavirus NL63 NOT DETECTED NOT DETECTED Final   Coronavirus OC43 NOT DETECTED NOT DETECTED Final   Metapneumovirus NOT DETECTED NOT DETECTED Final   Rhinovirus / Enterovirus NOT DETECTED NOT DETECTED Final   Influenza A NOT DETECTED NOT DETECTED Final   Influenza B NOT DETECTED NOT DETECTED Final   Parainfluenza Virus 1 NOT DETECTED NOT DETECTED Final   Parainfluenza Virus 2 NOT DETECTED NOT DETECTED Final   Parainfluenza Virus 3 NOT DETECTED NOT DETECTED Final   Parainfluenza Virus 4 NOT DETECTED NOT DETECTED Final   Respiratory Syncytial Virus NOT DETECTED NOT DETECTED Final   Bordetella pertussis NOT DETECTED NOT DETECTED Final   Chlamydophila pneumoniae NOT DETECTED NOT DETECTED Final   Mycoplasma  pneumoniae NOT DETECTED NOT DETECTED Final  Culture, blood (routine x 2)     Status: None (Preliminary result)   Collection Time: 04/26/16  5:37 PM  Result Value Ref Range Status   Specimen Description BLOOD LEFT HAND  Final   Special Requests BOTTLES DRAWN AEROBIC ONLY 5CC  Final   Culture NO GROWTH 2 DAYS  Final   Report Status PENDING  Incomplete  Culture, blood (routine x 2)     Status: None (Preliminary result)   Collection Time: 04/26/16  5:37 PM  Result Value Ref Range Status   Specimen Description BLOOD RIGHT HAND  Final   Special Requests BOTTLES DRAWN AEROBIC ONLY 5CC  Final   Culture NO GROWTH 2 DAYS  Final   Report Status PENDING  Incomplete     Labs: BNP (last  3 results) No results for input(s): BNP in the last 8760 hours. Basic Metabolic Panel:  Recent Labs Lab 04/22/16 1610 04/23/16 0213 04/24/16 0508 04/25/16 0635 04/27/16 0551  NA 139 139 137 139 137  K 3.7 4.1 4.4 3.8 3.9  CL 104 106 107 107 104  CO2 27 23 24 25 24   GLUCOSE 141* 85 130* 107* 113*  BUN 14 17 12 9 10   CREATININE 1.61* 1.23 0.71 0.68 0.67  CALCIUM 8.5* 8.2* 8.2* 8.6* 8.6*   Liver Function Tests:  Recent Labs Lab 04/22/16 1610  AST 47*  ALT 31  ALKPHOS 73  BILITOT 0.3  PROT 6.6  ALBUMIN 2.8*   No results for input(s): LIPASE, AMYLASE in the last 168 hours. No results for input(s): AMMONIA in the last 168 hours. CBC:  Recent Labs Lab 04/23/16 0213 04/24/16 0508 04/25/16 0635 04/26/16 0524 04/27/16 0551  WBC 2.7* 3.6* 2.0* 2.4* 2.1*  NEUTROABS  --  3.1 1.5* 1.7 1.6*  HGB 10.4* 9.9* 10.4* 10.0* 10.2*  HCT 34.7* 31.4* 33.6* 32.3* 33.0*  MCV 79.8 78.3 78.1 79.0 78.0  PLT 192 188 177 183 215   Cardiac Enzymes:  Recent Labs Lab 04/26/16 1106  CKTOTAL 31*   BNP: Invalid input(s): POCBNP CBG: No results for input(s): GLUCAP in the last 168 hours. D-Dimer No results for input(s): DDIMER in the last 72 hours. Hgb A1c No results for input(s): HGBA1C in the last 72  hours. Lipid Profile No results for input(s): CHOL, HDL, LDLCALC, TRIG, CHOLHDL, LDLDIRECT in the last 72 hours. Thyroid function studies No results for input(s): TSH, T4TOTAL, T3FREE, THYROIDAB in the last 72 hours.  Invalid input(s): FREET3 Anemia work up No results for input(s): VITAMINB12, FOLATE, FERRITIN, TIBC, IRON, RETICCTPCT in the last 72 hours. Urinalysis    Component Value Date/Time   COLORURINE YELLOW 04/22/2016 2230   APPEARANCEUR CLOUDY (A) 04/22/2016 2230   LABSPEC 1.021 04/22/2016 2230   PHURINE 5.5 04/22/2016 2230   GLUCOSEU NEGATIVE 04/22/2016 2230   HGBUR NEGATIVE 04/22/2016 2230   BILIRUBINUR SMALL (A) 04/22/2016 2230   KETONESUR 15 (A) 04/22/2016 2230   PROTEINUR 100 (A) 04/22/2016 2230   NITRITE NEGATIVE 04/22/2016 2230   LEUKOCYTESUR TRACE (A) 04/22/2016 2230   Sepsis Labs Invalid input(s): PROCALCITONIN,  WBC,  LACTICIDVEN Microbiology Recent Results (from the past 240 hour(s))  Culture, blood (routine x 2)     Status: None   Collection Time: 04/22/16  9:07 PM  Result Value Ref Range Status   Specimen Description BLOOD LEFT ARM  Final   Special Requests BOTTLES DRAWN AEROBIC AND ANAEROBIC 5CC  Final   Culture NO GROWTH 5 DAYS  Final   Report Status 04/27/2016 FINAL  Final  Culture, blood (routine x 2)     Status: None   Collection Time: 04/22/16  9:15 PM  Result Value Ref Range Status   Specimen Description BLOOD LEFT ANTECUBITAL  Final   Special Requests BOTTLES DRAWN AEROBIC AND ANAEROBIC 5CC  Final   Culture NO GROWTH 5 DAYS  Final   Report Status 04/27/2016 FINAL  Final  Culture, Urine     Status: None   Collection Time: 04/22/16 10:30 PM  Result Value Ref Range Status   Specimen Description URINE, RANDOM  Final   Special Requests ADDED [id number] 1320  Final   Culture NO GROWTH  Final   Report Status 04/24/2016 FINAL  Final  Respiratory Panel by PCR     Status: None   Collection Time: 04/23/16  2:05 PM  Result Value Ref Range Status    Adenovirus NOT DETECTED NOT DETECTED Final   Coronavirus 229E NOT DETECTED NOT DETECTED Final   Coronavirus HKU1 NOT DETECTED NOT DETECTED Final   Coronavirus NL63 NOT DETECTED NOT DETECTED Final   Coronavirus OC43 NOT DETECTED NOT DETECTED Final   Metapneumovirus NOT DETECTED NOT DETECTED Final   Rhinovirus / Enterovirus NOT DETECTED NOT DETECTED Final   Influenza A NOT DETECTED NOT DETECTED Final   Influenza B NOT DETECTED NOT DETECTED Final   Parainfluenza Virus 1 NOT DETECTED NOT DETECTED Final   Parainfluenza Virus 2 NOT DETECTED NOT DETECTED Final   Parainfluenza Virus 3 NOT DETECTED NOT DETECTED Final   Parainfluenza Virus 4 NOT DETECTED NOT DETECTED Final   Respiratory Syncytial Virus NOT DETECTED NOT DETECTED Final   Bordetella pertussis NOT DETECTED NOT DETECTED Final   Chlamydophila pneumoniae NOT DETECTED NOT DETECTED Final   Mycoplasma pneumoniae NOT DETECTED NOT DETECTED Final  Culture, blood (routine x 2)     Status: None (Preliminary result)   Collection Time: 04/26/16  5:37 PM  Result Value Ref Range Status   Specimen Description BLOOD LEFT HAND  Final   Special Requests BOTTLES DRAWN AEROBIC ONLY 5CC  Final   Culture NO GROWTH 2 DAYS  Final   Report Status PENDING  Incomplete  Culture, blood (routine x 2)     Status: None (Preliminary result)   Collection Time: 04/26/16  5:37 PM  Result Value Ref Range Status   Specimen Description BLOOD RIGHT HAND  Final   Special Requests BOTTLES DRAWN AEROBIC ONLY 5CC  Final   Culture NO GROWTH 2 DAYS  Final   Report Status PENDING  Incomplete     Time coordinating discharge: 45 minutes  SIGNED:   Coralie KeensMauricio Daniel Arrien, MD  [facility] 04/28/2016, 2:05 PM Pager   If 7PM-7AM, please contact night-coverage [email or web address] Password [email or web address]

## 2016-04-28 NOTE — Care Management Note (Addendum)
Case Management Note  Patient Details  Name: Marvin Wong MRN: [id number] Date of Birth: 12/25/1980  Subjective/Objective:    AKI, facial and neck edema, angioedema, HTN                Action/Plan: Discharge Planning: AVS reviewed: NCM spoke to pt and wife at home to assist with his care. No NCM needs identified. Pt states he see Floydene FlockViola Jones NP. Pt gave permission to fax dc summary to PCP.    Edward JollyPCP-Jones, Viola NP  Expected Discharge Date:  04/28/2016              Expected Discharge Plan:  Home/Self Care  In-House Referral:  NA  Discharge planning Services  CM Consult  Post Acute Care Choice:  NA Choice offered to:  NA  DME Arranged:  N/A DME Agency:  NA  HH Arranged:  NA HH Agency:  NA  Status of Service:  Completed, signed off  If discussed at Long Length of Stay Meetings, dates discussed:    Additional Comments:  Elliot CousinShavis, Kelbie Moro Ellen, RN 04/28/2016, 2:35 PM

## 2016-04-29 LAB — BLOOD CULTURE ID PANEL (REFLEXED)
ACINETOBACTER BAUMANNII: NOT DETECTED
CANDIDA ALBICANS: NOT DETECTED
CANDIDA KRUSEI: NOT DETECTED
Candida glabrata: NOT DETECTED
Candida parapsilosis: NOT DETECTED
Candida tropicalis: NOT DETECTED
ENTEROBACTER CLOACAE COMPLEX: NOT DETECTED
ENTEROBACTERIACEAE SPECIES: NOT DETECTED
ENTEROCOCCUS SPECIES: NOT DETECTED
ESCHERICHIA COLI: NOT DETECTED
Haemophilus influenzae: NOT DETECTED
Klebsiella oxytoca: NOT DETECTED
Klebsiella pneumoniae: NOT DETECTED
LISTERIA MONOCYTOGENES: NOT DETECTED
Neisseria meningitidis: NOT DETECTED
PSEUDOMONAS AERUGINOSA: NOT DETECTED
Proteus species: NOT DETECTED
SERRATIA MARCESCENS: NOT DETECTED
STAPHYLOCOCCUS AUREUS BCID: NOT DETECTED
STREPTOCOCCUS PNEUMONIAE: NOT DETECTED
STREPTOCOCCUS PYOGENES: NOT DETECTED
Staphylococcus species: NOT DETECTED
Streptococcus agalactiae: NOT DETECTED
Streptococcus species: NOT DETECTED

## 2016-05-01 LAB — CULTURE, BLOOD (ROUTINE X 2): CULTURE: NO GROWTH

## 2016-05-02 NOTE — Plan of Care (Signed)
Received a call from patient's primary practitioner, Ms. Marvin FlockViola Jones, NP.  Patient had 1/2 blood cultures with [facility]. Resulted as diphtheria. Spoke with Dr. Luciana Axeomer, infectious disease, who stated if patient was asymptomatic, this was likely contamination. No treatment at this time. Spoke with Miss Marvin Wong, patient recently seen in the office for hospital follow-up, patient is feeling better, vital signs were stable. Given this information, would not recommend any treatment. Explained that this is likely contamination.  Time spent: 15 minutes  Marvin Wong D.O. [facility] Pager [phone]  If 7PM-7AM, please contact night-coverage [email or web address] Password [email or web address] 05/02/2016, 11:20 AM

## 2018-07-02 IMAGING — CT CT NECK W/O CM
3 of 4 series · 14 of 33 positions shown, 17 images · IV contrast (APPLIED)
Comparison: 10/16/2014 face CT

CLINICAL DATA: Bilateral neck swelling. Symptoms began last [REDACTED].

EXAM:
CT NECK WITHOUT CONTRAST
TECHNIQUE: Multidetector CT imaging of the neck was performed following the
standard protocol without intravenous contrast.

[Series 5: coronal st · coronal · 0.39mm/px · 3 of 107 slices shown]
[im 24/107  bone]
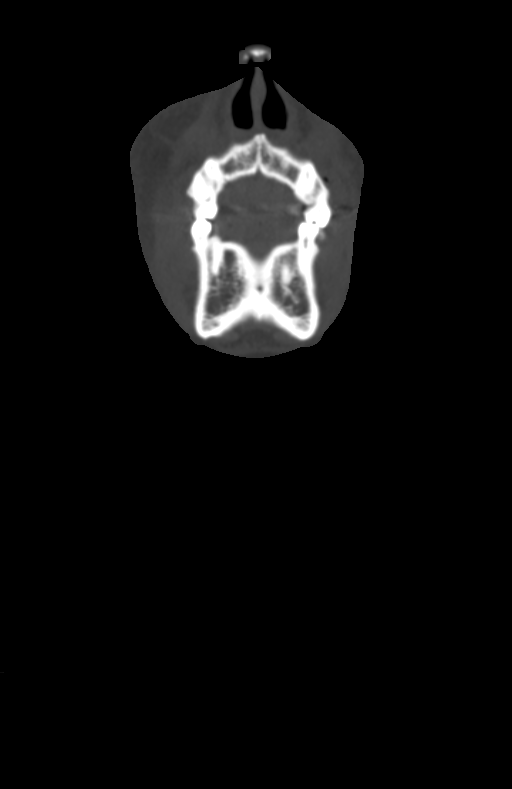
[im 44/107  bone]
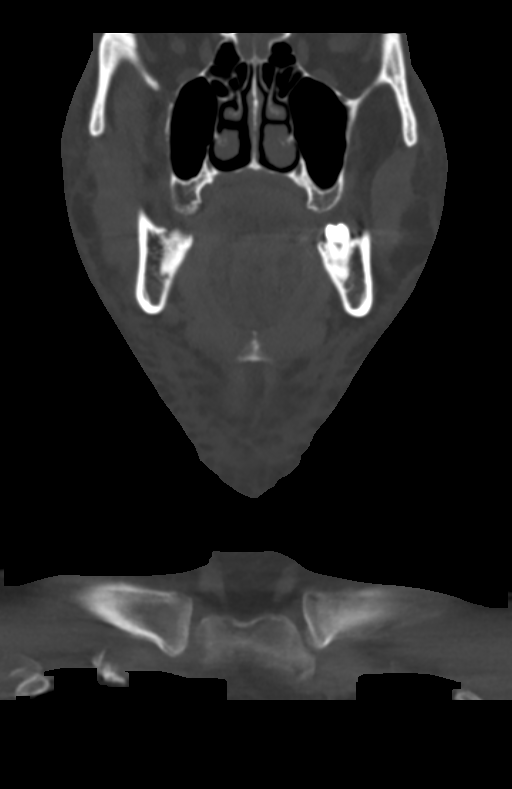
[im 63/107  bone]
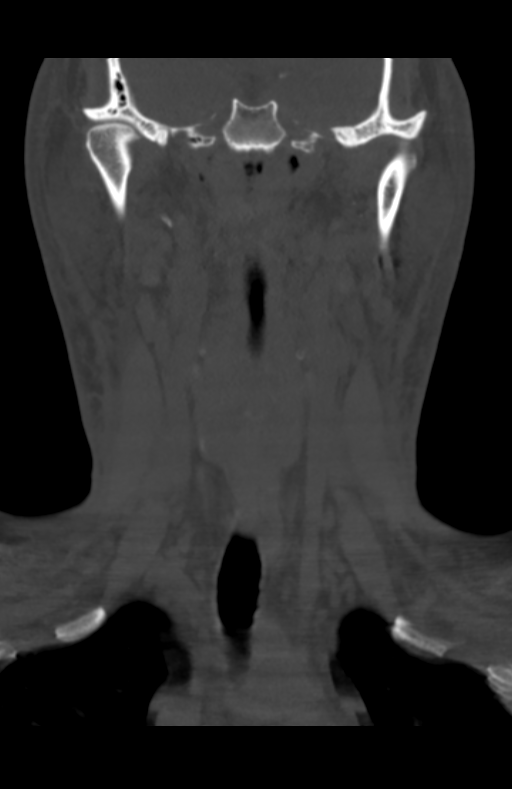

[Series 6: sagittal st · sagittal · 0.46mm/px · 5 of 101 slices shown, 6 images]
[im 34/101  bone]
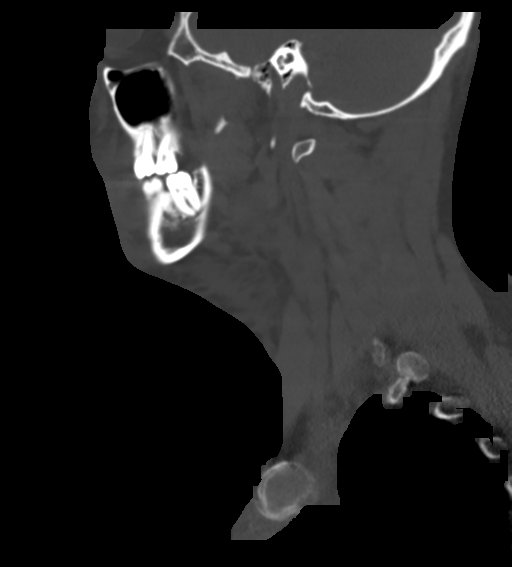
[im 42/101  bone]
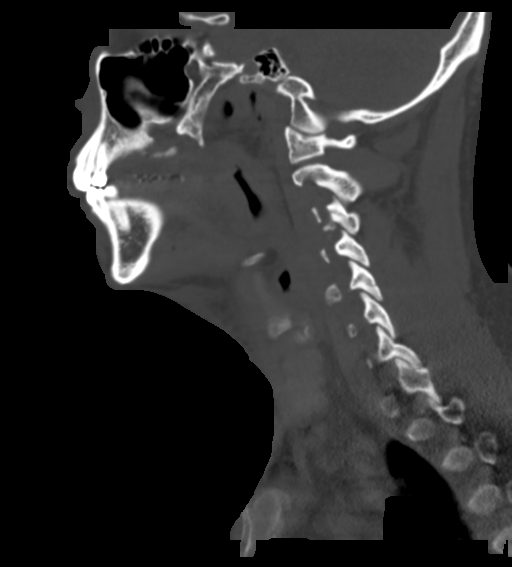
[im 51/101  soft-tissue]
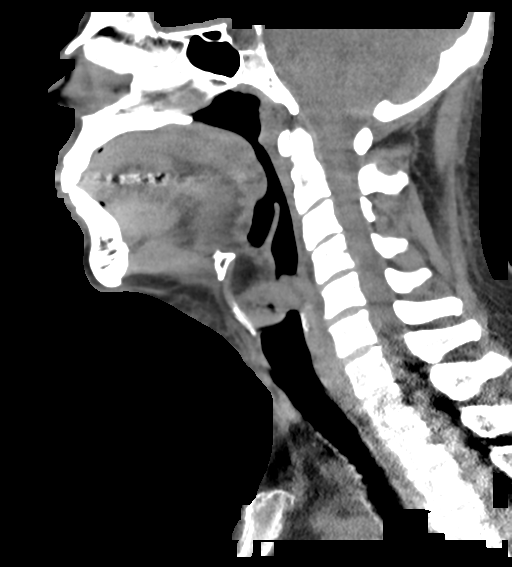
[im 51/101  bone]
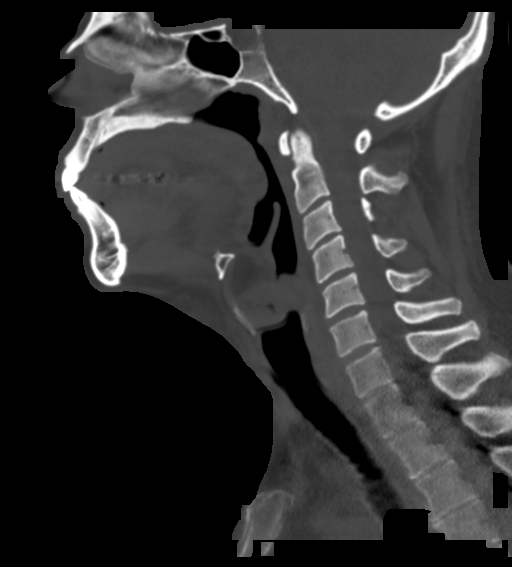
[im 59/101  bone]
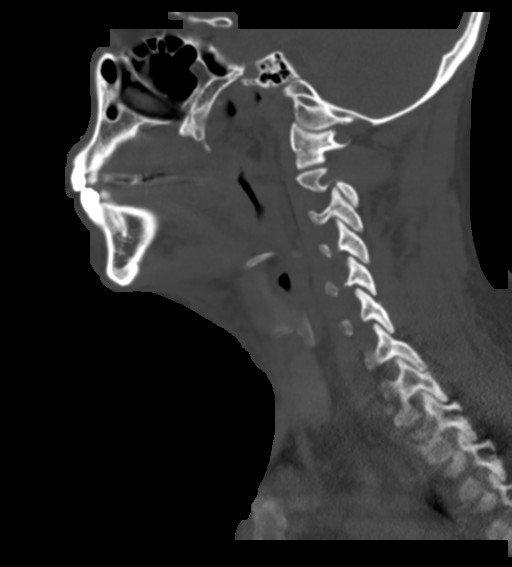
[im 67/101  bone]
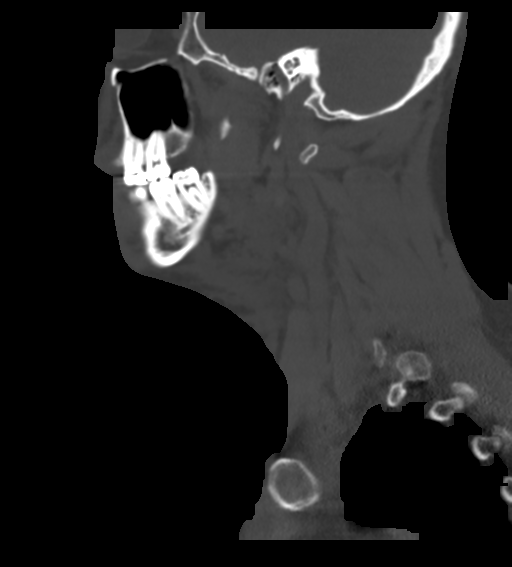

[Series 7: orthogonal st · axial · 0.39mm/px · z∈[+1156,+1353]mm · 6 of 147 slices shown, 8 images]
[im 21/147  soft-tissue]
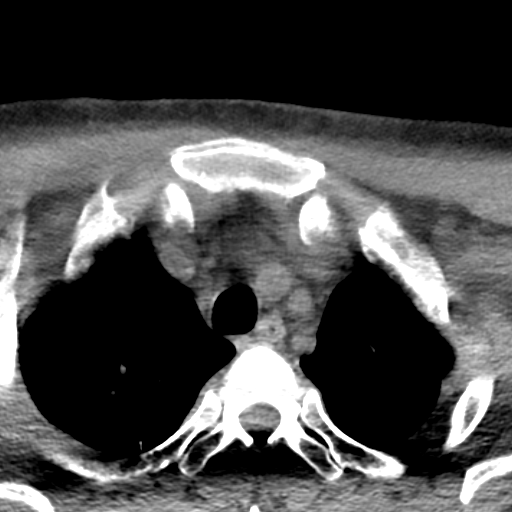
[im 21/147  bone]
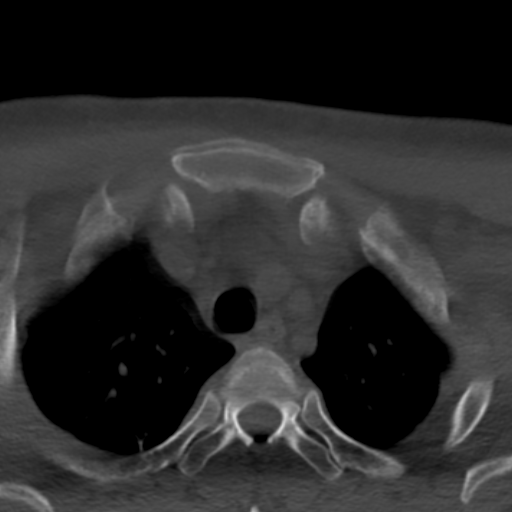
[im 42/147  bone]
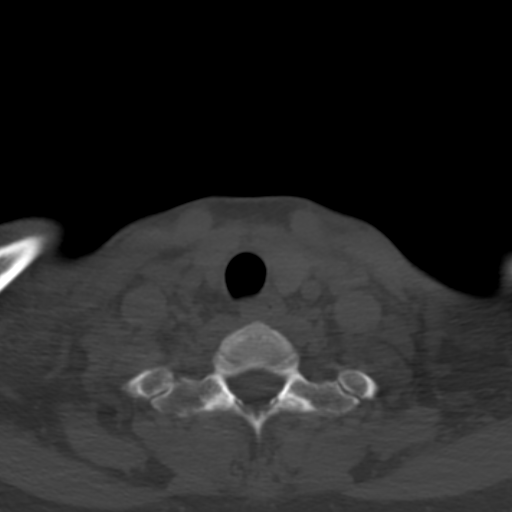
[im 63/147  bone]
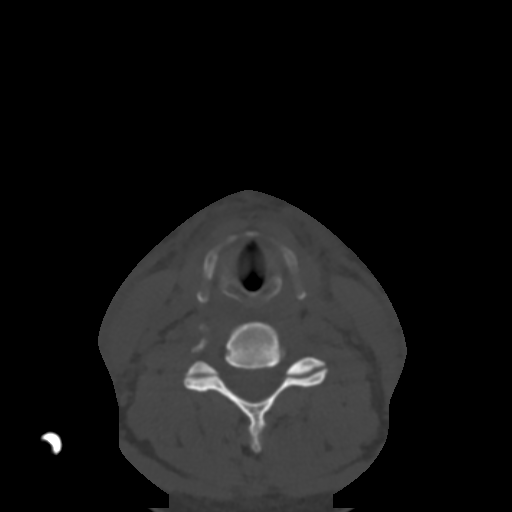
[im 84/147  bone]
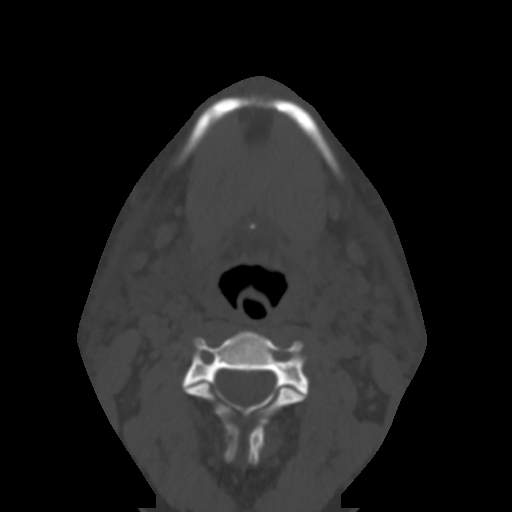
[im 105/147  soft-tissue]
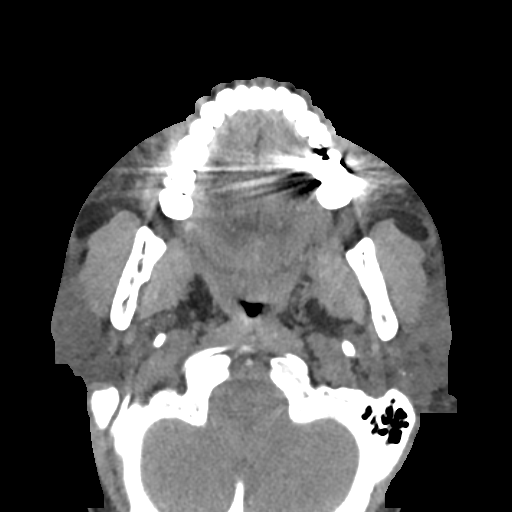
[im 105/147  bone]
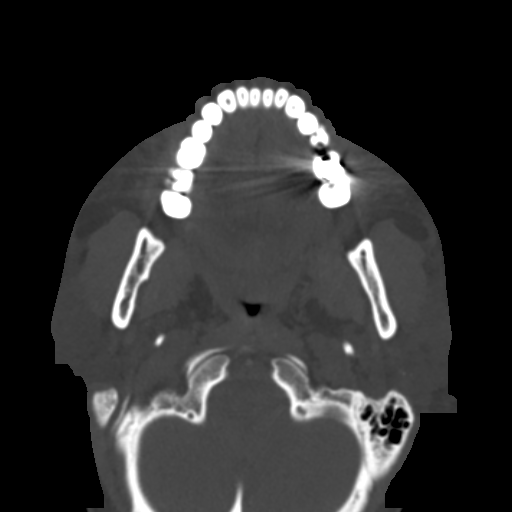
[im 126/147  bone]
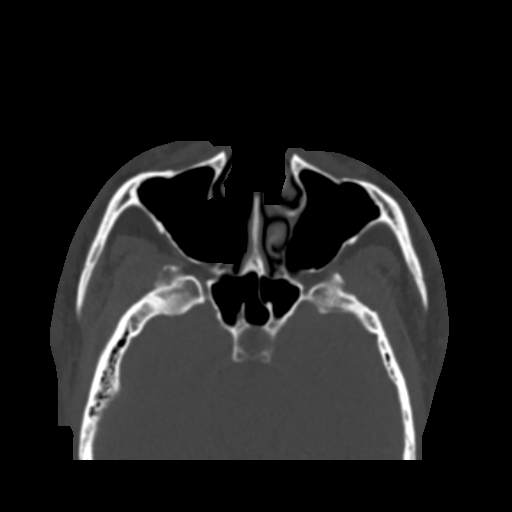

[14 of 33 positions shown; findings below may reference images not displayed]

FINDINGS: Pharynx and larynx: Negative for mass or submucosal edema.

Salivary glands: Multiple calculi in the left parotid and to a
lesser extent the right parotid. No primary gland inflammation is
noted. Small submandibular glands.

Thyroid: Negative

Lymph nodes: No concerning enlargement.  No abnormal density.

Vascular: Negative

Limited intracranial: Negative

Visualized orbits: Negative

Mastoids and visualized paranasal sinuses: Clear

Skeleton: No acute or aggressive finding.

Upper chest: No acute finding.

Other: Symmetric nonspecific subcutaneous fat edema and platysma
thickening. Similar changes were seen on 7369 maxillofacial CT, but
increased today. If no infectious symptoms, question relationship to
lupus. Angioedema is also considered. No opaque foreign body or soft
tissue emphysema. No airway mass-effect.
IMPRESSION: 1. Symmetric facial and upper neck edema similar to 7369, although
greater today. No deep or airway inflammatory process is noted.
Further discussion above.
2. Bilateral parotid sialolithiasis.
